# Patient Record
Sex: Male | Born: 1955 | Race: White | Hispanic: No | Marital: Married | Smoking: Former smoker
Health system: Southern US, Community
[De-identification: ages and names within clinical notes are randomized; demographics above are authoritative.]

## PROBLEM LIST (undated history)

## (undated) DIAGNOSIS — R55 Syncope and collapse: Secondary | ICD-10-CM

## (undated) DIAGNOSIS — I493 Ventricular premature depolarization: Secondary | ICD-10-CM

## (undated) DIAGNOSIS — M48061 Spinal stenosis, lumbar region without neurogenic claudication: Secondary | ICD-10-CM

## (undated) DIAGNOSIS — E785 Hyperlipidemia, unspecified: Secondary | ICD-10-CM

## (undated) HISTORY — DX: Syncope and collapse: R55

## (undated) HISTORY — DX: Ventricular premature depolarization: I49.3

## (undated) HISTORY — DX: Hyperlipidemia, unspecified: E78.5

## (undated) HISTORY — DX: Spinal stenosis, lumbar region without neurogenic claudication: M48.061

---

## 2006-01-14 ENCOUNTER — Encounter: Admission: RE | Admit: 2006-01-14 | Discharge: 2006-04-14 | Payer: Self-pay | Admitting: Internal Medicine

## 2008-09-21 ENCOUNTER — Encounter: Admission: RE | Admit: 2008-09-21 | Discharge: 2008-09-21 | Payer: Self-pay | Admitting: Internal Medicine

## 2009-10-04 ENCOUNTER — Encounter: Admission: RE | Admit: 2009-10-04 | Discharge: 2009-10-04 | Payer: Self-pay | Admitting: Internal Medicine

## 2012-03-05 ENCOUNTER — Other Ambulatory Visit: Payer: Self-pay | Admitting: Internal Medicine

## 2012-03-05 DIAGNOSIS — M79606 Pain in leg, unspecified: Secondary | ICD-10-CM

## 2012-03-10 ENCOUNTER — Ambulatory Visit
Admission: RE | Admit: 2012-03-10 | Discharge: 2012-03-10 | Disposition: A | Discharge status: Home / Self Care | Payer: BC Managed Care – PPO | Source: Ambulatory Visit | Attending: Internal Medicine | Admitting: Internal Medicine

## 2012-03-10 DIAGNOSIS — M79606 Pain in leg, unspecified: Secondary | ICD-10-CM

## 2013-10-12 ENCOUNTER — Encounter: Payer: Self-pay | Admitting: Cardiology

## 2013-10-12 ENCOUNTER — Encounter: Payer: Self-pay | Admitting: *Deleted

## 2013-10-12 DIAGNOSIS — R55 Syncope and collapse: Secondary | ICD-10-CM | POA: Insufficient documentation

## 2013-10-12 DIAGNOSIS — E785 Hyperlipidemia, unspecified: Secondary | ICD-10-CM | POA: Insufficient documentation

## 2013-10-12 DIAGNOSIS — I493 Ventricular premature depolarization: Secondary | ICD-10-CM | POA: Insufficient documentation

## 2013-10-13 ENCOUNTER — Ambulatory Visit (HOSPITAL_COMMUNITY): Payer: BC Managed Care – PPO | Attending: Cardiology

## 2013-10-13 ENCOUNTER — Encounter: Payer: Self-pay | Admitting: Cardiology

## 2013-10-13 ENCOUNTER — Other Ambulatory Visit (HOSPITAL_COMMUNITY): Payer: Self-pay | Admitting: Cardiology

## 2013-10-13 ENCOUNTER — Ambulatory Visit (INDEPENDENT_AMBULATORY_CARE_PROVIDER_SITE_OTHER): Payer: BC Managed Care – PPO | Admitting: Cardiology

## 2013-10-13 VITALS — BP 136/86 | HR 57 | Ht 68.0 in | Wt 165.0 lb

## 2013-10-13 DIAGNOSIS — R42 Dizziness and giddiness: Secondary | ICD-10-CM

## 2013-10-13 DIAGNOSIS — I4949 Other premature depolarization: Secondary | ICD-10-CM

## 2013-10-13 DIAGNOSIS — R55 Syncope and collapse: Secondary | ICD-10-CM

## 2013-10-13 DIAGNOSIS — I059 Rheumatic mitral valve disease, unspecified: Secondary | ICD-10-CM

## 2013-10-13 DIAGNOSIS — I493 Ventricular premature depolarization: Secondary | ICD-10-CM

## 2013-10-13 DIAGNOSIS — I340 Nonrheumatic mitral (valve) insufficiency: Secondary | ICD-10-CM | POA: Insufficient documentation

## 2013-10-13 NOTE — Progress Notes (Signed)
Echocardiogram performed.  

## 2013-10-13 NOTE — Patient Instructions (Signed)
Your physician recommends that you continue on your current medications as directed. Please refer to the Current Medication list given to you today.  Your physician has requested that you have an echocardiogram. Echocardiography is a painless test that uses sound waves to create images of your heart. It provides your doctor with information about the size and shape of your heart and how well your heart's chambers and valves are working. This procedure takes approximately one hour. There are no restrictions for this procedure. You will receive a letter in 10 months to call us to schedule your echo 1 year out.   Your physician wants you to follow-up in: 1 year with Dr. Sherlyn Lick will receive a reminder letter in the mail two months in advance. If you don't receive a letter, please call our office to schedule the follow-up appointment.  You have been referred to Dr. Flo Shanks For a consultation for Right ear Pain and Vertigo.

## 2013-10-13 NOTE — Progress Notes (Signed)
  [address] [place], [place]  [address] Phone: [phone] Fax:  [phone]  Date:  10/13/2013   ID:  Islam Eichinger, DOB 12-30-1955, MRN [id number]  PCP:  Lillia Mountain, MD  Cardiologist:  Armanda Magic, MD     History of Present Illness: Ian Patton is a 57 y.o. male with a history for dyslipidemia and presyncope.  He recently saw me in August with complaints of presyncope.  These have occurred with various acitivities including hiking, vigorous walking or running.  He describes the sensation as the room spinning.  He underwent nuclear stress test which showed diaphragmatic attenuation and no ischemia with normal LVF.  Heart monitor showed NSR with PVC's and PAC's.  He had a 2D echo done today which showed normal LVF with mild to mod MR and mild TR.  Since I saw him last his dizziness has significantly improved.  He says that if he gets any dizziness it is very mild and short lived.  He recently went on vacation and went mountain hiking and did not get dizzy at all.   Wt Readings from Last 3 Encounters:  10/13/13 165 lb (74.844 kg)     Past Medical History  Diagnosis Date  . Hyperlipidemia   . Lumbar stenosis   . Near syncope   . PVC's (premature ventricular contractions)     Current Outpatient Prescriptions  Medication Sig Dispense Refill  . Multiple Vitamin (MULTIVITAMIN) capsule Take 1 capsule by mouth daily.       No current facility-administered medications for this visit.    Allergies:    Allergies  Allergen Reactions  . Atorvastatin     Muscle aches (Brand Lipitor ok )    Social History:  The patient  reports that he quit smoking about 23 years ago. He does not have any smokeless tobacco history on file. He reports that he does not use illicit drugs.   Family History:  The patient's family history includes Dementia in his father and mother; Schizophrenia in his brother; Suicidality in his brother and brother.   ROS:  Please see the  history of present illness.      All other systems reviewed and negative.   PHYSICAL EXAM: VS:  BP 136/86  Ht 5\' 8"  (1.727 m)  Wt 165 lb (74.844 kg)  BMI 25.09 kg/m2 Well nourished, well developed, in no acute distress HEENT: normal Neck: no JVD Cardiac:  normal S1, S2; RRR; no murmur Lungs:  clear to auscultation bilaterally, no wheezing, rhonchi or rales Abd: soft, nontender, no hepatomegaly Ext: no edema Skin: warm and dry Neuro:  CNs 2-12 intact, no focal abnormalities noted       ASSESSMENT AND PLAN:  1. Presyncope appears to have resolved with negative cardiac workup.  I think his symptoms are more related to vertigo.  He also has had some pain behind his right ear.  - I will refer him to ENT for eval 2.  Mild to moderate MR by  - will follow with serial echoes yearly   Followup with me in 1 year  Signed, Armanda Magic, MD 10/13/2013 9:54 AM

## 2014-02-01 ENCOUNTER — Encounter: Payer: Self-pay | Admitting: Cardiology

## 2014-03-28 ENCOUNTER — Encounter: Payer: Self-pay | Admitting: Cardiology

## 2014-03-29 ENCOUNTER — Encounter: Payer: Self-pay | Admitting: Cardiology

## 2014-03-30 ENCOUNTER — Encounter: Payer: Self-pay | Admitting: Cardiology

## 2014-03-30 ENCOUNTER — Encounter: Payer: Self-pay | Admitting: Internal Medicine

## 2014-03-31 ENCOUNTER — Encounter: Payer: Self-pay | Admitting: Nurse Practitioner

## 2014-08-18 ENCOUNTER — Encounter: Payer: Self-pay | Admitting: Cardiology

## 2016-06-19 ENCOUNTER — Ambulatory Visit (INDEPENDENT_AMBULATORY_CARE_PROVIDER_SITE_OTHER): Payer: Managed Care, Other (non HMO) | Admitting: Sports Medicine

## 2016-06-19 ENCOUNTER — Encounter: Payer: Self-pay | Admitting: Sports Medicine

## 2016-06-19 VITALS — BP 116/77 | HR 56 | Ht 68.0 in | Wt 155.0 lb

## 2016-06-19 DIAGNOSIS — M23307 Other meniscus derangements, unspecified meniscus, left knee: Secondary | ICD-10-CM | POA: Diagnosis not present

## 2016-06-19 DIAGNOSIS — M23304 Other meniscus derangements, unspecified medial meniscus, left knee: Secondary | ICD-10-CM

## 2016-06-19 DIAGNOSIS — M23301 Other meniscus derangements, unspecified lateral meniscus, left knee: Secondary | ICD-10-CM

## 2016-06-19 DIAGNOSIS — M545 Low back pain, unspecified: Secondary | ICD-10-CM

## 2016-06-19 DIAGNOSIS — M23309 Other meniscus derangements, unspecified meniscus, unspecified knee: Secondary | ICD-10-CM | POA: Insufficient documentation

## 2016-06-19 NOTE — Assessment & Plan Note (Signed)
Needs to resume some flexion exercises  Be careful w lifting and heavier work  Reck if not resolving

## 2016-06-19 NOTE — Assessment & Plan Note (Signed)
HEP  Relative rest Biking OK but keep resistance low  Reck if not improved by 6 wks

## 2016-06-19 NOTE — Progress Notes (Signed)
Patient ID: Ian BilberryJohann Patton, male   DOB: 1956/12/09, 60 y.o.   MRN: [id number]  CC: left knee pain  Active male who bikes, runs and does hiking About 1 month ago more medial joint line pain on Lt No specific injury Worse after running so he stopped Initially some mild swelling Mild pain on bike  Low back pain Hs of bad sciatica down left leg in past Went to ER and put on prednisone 3  days ago chopping wood Since he has back pain  No sciatica No weakness  ROS Denies locking No giving way  No bowel or bladder sxs No weakness in lower ext  Pexam Thin, athletic M in NAD BP 116/77 mmHg  Pulse 56  Ht 5\' 8"  (1.727 m)  Wt 155 lb (70.308 kg)  BMI 23.57 kg/m2  Knee: Normal to inspection with no erythema or effusion or obvious bony abnormalities. Palpation normal with no warmth or joint line tenderness or patellar tenderness or condyle tenderness. ROM normal in flexion and extension and lower leg rotation. Ligaments with solid consistent endpoints including ACL, PCL, LCL, MCL. Negative + Mcmurray's and provocative meniscal tests. Non painful patellar compression. Patellar and quadriceps tendons unremarkable. Hamstring and quadriceps strength is normal.  Low Back Pain with 20 deg extension Pain with flexion at 50 deg Good strength Norm toe and heel walk Loss of lumbar lordosis Neg SLR  US Lt knee  No SPP effusion Mild deg change of med men with mild joint line swelling Lat men is norm PT/QT normal

## 2016-06-20 ENCOUNTER — Encounter: Payer: Self-pay | Admitting: Cardiology

## 2019-02-17 DIAGNOSIS — F419 Anxiety disorder, unspecified: Secondary | ICD-10-CM | POA: Diagnosis not present

## 2019-02-17 DIAGNOSIS — Z0000 Encounter for general adult medical examination without abnormal findings: Secondary | ICD-10-CM | POA: Diagnosis not present

## 2019-02-17 DIAGNOSIS — Z789 Other specified health status: Secondary | ICD-10-CM | POA: Diagnosis not present

## 2019-02-17 DIAGNOSIS — Z1159 Encounter for screening for other viral diseases: Secondary | ICD-10-CM | POA: Diagnosis not present

## 2019-02-17 DIAGNOSIS — D126 Benign neoplasm of colon, unspecified: Secondary | ICD-10-CM | POA: Diagnosis not present

## 2019-05-23 DIAGNOSIS — E559 Vitamin D deficiency, unspecified: Secondary | ICD-10-CM | POA: Diagnosis not present

## 2020-02-22 DIAGNOSIS — E559 Vitamin D deficiency, unspecified: Secondary | ICD-10-CM | POA: Diagnosis not present

## 2020-02-22 DIAGNOSIS — R6889 Other general symptoms and signs: Secondary | ICD-10-CM | POA: Diagnosis not present

## 2020-02-22 DIAGNOSIS — E7800 Pure hypercholesterolemia, unspecified: Secondary | ICD-10-CM | POA: Diagnosis not present

## 2020-02-22 DIAGNOSIS — R251 Tremor, unspecified: Secondary | ICD-10-CM | POA: Diagnosis not present

## 2020-02-22 DIAGNOSIS — N400 Enlarged prostate without lower urinary tract symptoms: Secondary | ICD-10-CM | POA: Diagnosis not present

## 2020-02-22 DIAGNOSIS — Z86010 Personal history of colonic polyps: Secondary | ICD-10-CM | POA: Diagnosis not present

## 2020-02-22 DIAGNOSIS — Z0000 Encounter for general adult medical examination without abnormal findings: Secondary | ICD-10-CM | POA: Diagnosis not present

## 2020-11-16 DIAGNOSIS — Z1159 Encounter for screening for other viral diseases: Secondary | ICD-10-CM | POA: Diagnosis not present

## 2020-11-21 DIAGNOSIS — D123 Benign neoplasm of transverse colon: Secondary | ICD-10-CM | POA: Diagnosis not present

## 2020-11-21 DIAGNOSIS — D124 Benign neoplasm of descending colon: Secondary | ICD-10-CM | POA: Diagnosis not present

## 2020-11-21 DIAGNOSIS — D122 Benign neoplasm of ascending colon: Secondary | ICD-10-CM | POA: Diagnosis not present

## 2020-11-21 DIAGNOSIS — D120 Benign neoplasm of cecum: Secondary | ICD-10-CM | POA: Diagnosis not present

## 2020-11-21 DIAGNOSIS — K5730 Diverticulosis of large intestine without perforation or abscess without bleeding: Secondary | ICD-10-CM | POA: Diagnosis not present

## 2020-11-21 DIAGNOSIS — Z86010 Personal history of colonic polyps: Secondary | ICD-10-CM | POA: Diagnosis not present

## 2020-11-21 DIAGNOSIS — K621 Rectal polyp: Secondary | ICD-10-CM | POA: Diagnosis not present

## 2020-11-21 DIAGNOSIS — K635 Polyp of colon: Secondary | ICD-10-CM | POA: Diagnosis not present

## 2021-04-29 ENCOUNTER — Other Ambulatory Visit (HOSPITAL_COMMUNITY): Payer: Self-pay | Admitting: Internal Medicine

## 2021-04-29 DIAGNOSIS — E7800 Pure hypercholesterolemia, unspecified: Secondary | ICD-10-CM | POA: Diagnosis not present

## 2021-04-29 DIAGNOSIS — I340 Nonrheumatic mitral (valve) insufficiency: Secondary | ICD-10-CM | POA: Diagnosis not present

## 2021-04-29 DIAGNOSIS — Z0000 Encounter for general adult medical examination without abnormal findings: Secondary | ICD-10-CM | POA: Diagnosis not present

## 2021-04-29 DIAGNOSIS — M79641 Pain in right hand: Secondary | ICD-10-CM | POA: Diagnosis not present

## 2021-05-21 ENCOUNTER — Other Ambulatory Visit: Payer: Self-pay | Admitting: Internal Medicine

## 2021-05-21 DIAGNOSIS — E7800 Pure hypercholesterolemia, unspecified: Secondary | ICD-10-CM

## 2021-05-27 ENCOUNTER — Other Ambulatory Visit (HOSPITAL_COMMUNITY): Payer: Managed Care, Other (non HMO)

## 2021-06-07 ENCOUNTER — Other Ambulatory Visit: Payer: Managed Care, Other (non HMO)

## 2021-06-10 ENCOUNTER — Ambulatory Visit
Admission: RE | Admit: 2021-06-10 | Discharge: 2021-06-10 | Disposition: A | Discharge status: Home / Self Care | Payer: No Typology Code available for payment source | Source: Ambulatory Visit | Attending: Internal Medicine | Admitting: Internal Medicine

## 2021-06-10 DIAGNOSIS — E7800 Pure hypercholesterolemia, unspecified: Secondary | ICD-10-CM

## 2021-08-21 ENCOUNTER — Ambulatory Visit (INDEPENDENT_AMBULATORY_CARE_PROVIDER_SITE_OTHER): Payer: BC Managed Care – PPO | Admitting: Internal Medicine

## 2021-08-21 ENCOUNTER — Telehealth (HOSPITAL_COMMUNITY): Payer: Self-pay | Admitting: *Deleted

## 2021-08-21 ENCOUNTER — Encounter: Payer: Self-pay | Admitting: Internal Medicine

## 2021-08-21 ENCOUNTER — Encounter: Payer: Self-pay | Admitting: *Deleted

## 2021-08-21 ENCOUNTER — Other Ambulatory Visit: Payer: Self-pay

## 2021-08-21 ENCOUNTER — Encounter (INDEPENDENT_AMBULATORY_CARE_PROVIDER_SITE_OTHER): Payer: Self-pay

## 2021-08-21 VITALS — BP 110/80 | HR 50 | Ht 68.0 in | Wt 165.0 lb

## 2021-08-21 DIAGNOSIS — E785 Hyperlipidemia, unspecified: Secondary | ICD-10-CM

## 2021-08-21 DIAGNOSIS — R931 Abnormal findings on diagnostic imaging of heart and coronary circulation: Secondary | ICD-10-CM

## 2021-08-21 NOTE — Patient Instructions (Signed)
Medication Instructions:  No changes *If you need a refill on your cardiac medications before your next appointment, please call your pharmacy*   Lab Work: None ordered If you have labs (blood work) drawn today and your tests are completely normal, you will receive your results only by: MyChart Message (if you have MyChart) OR A paper copy in the mail If you have any lab test that is abnormal or we need to change your treatment, we will call you to review the results.   Testing/Procedures: Your physician has requested that you have en exercise stress myoview. For further information please visit [email or web address] Please follow instruction sheet, as given.   Follow-Up: At [facility], you and your health needs are our priority.  As part of our continuing mission to provide you with exceptional heart care, we have created designated Provider Care Teams.  These Care Teams include your primary Cardiologist (physician) and Advanced Practice Providers (APPs -  Physician Assistants and Nurse Practitioners) who all work together to provide you with the care you need, when you need it.  We recommend signing up for the patient portal called "MyChart".  Sign up information is provided on this After Visit Summary.  MyChart is used to connect with patients for Virtual Visits (Telemedicine).  Patients are able to view lab/test results, encounter notes, upcoming appointments, etc.  Non-urgent messages can be sent to your provider as well.   To learn more about what you can do with MyChart, go to [email or web address].    Your next appointment:   9 month(s)  The format for your next appointment:   In Person  Provider:   You may see Dietrich Pates, MD or one of the following Advanced Practice Providers on your designated Care Team:   Tereso Newcomer, PA-C Chelsea Aus, [place]   Other Instructions Dr. Tenny Craw will review statin therapy that you'd tried with your PCP and then we will be in touch.

## 2021-08-21 NOTE — Telephone Encounter (Signed)
Left message on voicemail in reference to upcoming appointment scheduled for 08/28/21. Phone number given for a call back so details instructions can be given. Ian Patton, [name] No mychart available

## 2021-08-21 NOTE — Progress Notes (Signed)
Cardiology Office Note   Date:  08/21/2021   ID:  Ian Patton, DOB [birth year], MRN [id number]  PCP:  Kirby Funk, MD  Cardiologist:   Dietrich Pates, MD   Patient presents for evaluation of CAD    History of Present Illness: Breylin Patton is a 65 y.o. male who is followed by Roseanne Reno   Recently had a calcium score CT that showed a calcum score of551 ( 363 in LAD; 15 LCx  173 RCA)     The pt is very active   Bikes regularly  He says it is harder than it was 5 years ago but no rapid change Notes occasional chest tightness   Maybe a little more when pushes    He has no palpitaiotns   Occasional dizziness but n o syncope    Quit tob 30 years    Diet:    Br  Toast   Fruit   Oatmeal with seeds Lunch:   Salads     Soups   Evening:   Pasta, fish,   rare meat     Veggies  Drinks   Tea unsweet  Beer   wine      2 to 3 beers per day   No snack      Current Meds  Medication Sig   aspirin EC 81 MG tablet Take 81 mg by mouth daily. Swallow whole.   Multiple Vitamin (MULTIVITAMIN) capsule Take 1 capsule by mouth daily.     Allergies:   Atorvastatin   Past Medical History:  Diagnosis Date   Hyperlipidemia    Lumbar stenosis    Near syncope    PVC's (premature ventricular contractions)     History reviewed. No pertinent surgical history.   Social History:  The patient  reports that he has quit smoking. His smoking use included cigarettes. He has never used smokeless tobacco. He reports that he does not use drugs.   Family History:  The patient's family history includes Dementia in his father and mother; Schizophrenia in his brother; Suicidality in his brother and brother.   PGF with CVA and MI   Uncle paternal with MI    ROS:  Please see the history of present illness. All other systems are reviewed and  Negative to the above problem except as noted.    PHYSICAL EXAM: VS:  BP 110/80   Pulse (!) 50   Ht 5\' 8"  (1.727 m)   Wt 74.8 kg   SpO2 98%   BMI 25.09 kg/m    GEN: Well nourished, well developed, in no acute distress  HEENT: normal  Neck: no JVD, carotid bruits, Cardiac: RRR; no murmurs, rubs, or gallops,no LE edema  Respiratory:  clear to auscultation bilaterally, normal work of breathing GI: soft, nontender, nondistended, + BS  No hepatomegaly  MS: no deformity Moving all extremities   Skin: warm and dry, no rash Neuro:  Strength and sensation are intact Psych: euthymic mood, full affect   EKG:  EKG is ordered today.  Sinus bradycardia 50 bpm    Lipid Panel No results found for: CHOL, TRIG, HDL, CHOLHDL, VLDL, LDLCALC, LDLDIRECT    Wt Readings from Last 3 Encounters:  08/21/21 74.8 kg  06/19/16 70.3 kg  10/13/13 74.8 kg      ASSESSMENT AND PLAN:  1  CAD   Pt with elevated coronary calcium (551)   He is very activie   I am not convinced of any symptoms but given score I would recomm  a stress myoview to evaluat Keep on ASA   I would start statin  2   Lipids  LDL in May was 190  HDL 79   will get records for J Griffen    He says he may have trid a statin inp ast    Will review    3  Bradcyardia   No symptoms   Follow        Current medicines are reviewed at length with the patient today.  The patient does not have concerns regarding medicines.  Signed, Dietrich Pates, MD  08/21/2021 8:27 AM    [facility] [address] [place], [place], [place]  [address] Phone: [phone]; Fax: [phone]

## 2021-08-22 ENCOUNTER — Telehealth (HOSPITAL_COMMUNITY): Payer: Self-pay | Admitting: Radiology

## 2021-08-22 NOTE — Telephone Encounter (Signed)
Patient given detailed instructions per Myocardial Perfusion Study Information Sheet for the test on 08/28/2021 at 7:45. Patient notified to arrive 15 minutes early and that it is imperative to arrive on time for appointment to keep from having the test rescheduled.  If you need to cancel or reschedule your appointment, please call the office within 24 hours of your appointment. . Patient verbalized understanding.EHK

## 2021-08-28 ENCOUNTER — Other Ambulatory Visit: Payer: Self-pay

## 2021-08-28 ENCOUNTER — Ambulatory Visit (HOSPITAL_COMMUNITY): Payer: BC Managed Care – PPO | Attending: Internal Medicine

## 2021-08-28 DIAGNOSIS — E785 Hyperlipidemia, unspecified: Secondary | ICD-10-CM | POA: Diagnosis not present

## 2021-08-28 DIAGNOSIS — R931 Abnormal findings on diagnostic imaging of heart and coronary circulation: Secondary | ICD-10-CM | POA: Diagnosis not present

## 2021-08-28 LAB — MYOCARDIAL PERFUSION IMAGING
Angina Index: 0
Duke Treadmill Score: 12
Estimated workload: 13.4
Exercise duration (min): 12 min
Exercise duration (sec): 0 s
LV dias vol: 102 mL (ref 62–150)
LV sys vol: 45 mL
MPHR: 154 {beats}/min
Nuc Stress EF: 56 %
Peak HR: 130 {beats}/min
Percent HR: 84 %
Rest HR: 49 {beats}/min
Rest Nuclear Isotope Dose: 10.9 mCi
SDS: 0
SRS: 0
SSS: 0
ST Depression (mm): 0 mm
Stress Nuclear Isotope Dose: 31.6 mCi
TID: 0.99

## 2021-08-28 MED ORDER — TECHNETIUM TC 99M TETROFOSMIN IV KIT
31.6000 | PACK | Freq: Once | INTRAVENOUS | Status: AC | PRN
  Administered 2021-08-28: 31.6 via INTRAVENOUS
  Filled 2021-08-28: qty 32

## 2021-08-28 MED ORDER — TECHNETIUM TC 99M TETROFOSMIN IV KIT
10.0000 | PACK | Freq: Once | INTRAVENOUS | Status: AC | PRN
  Administered 2021-08-28: 10 via INTRAVENOUS
  Filled 2021-08-28: qty 10

## 2021-08-30 ENCOUNTER — Telehealth: Payer: Self-pay | Admitting: Internal Medicine

## 2021-08-30 NOTE — Telephone Encounter (Signed)
The patient has been notified of the result and verbalized understanding.  All questions (if any) were answered. Sampson Goon, RN 08/30/2021 8:46 AM

## 2021-08-30 NOTE — Telephone Encounter (Signed)
Patient returning call to discuss stress test results  

## 2021-09-03 ENCOUNTER — Telehealth: Payer: Self-pay | Admitting: Internal Medicine

## 2021-09-03 NOTE — Telephone Encounter (Signed)
Only record received from Dr Kandyce Rud was recent lipid panel He had been on atorvstatin in past   Did not tolerate Would like to try Crestor 10 mg   Have not seen any other records    If tries then f/u lipomed in 8 wks with Lp(a) and ApoB

## 2021-09-03 NOTE — Telephone Encounter (Signed)
Labs from [employer] office      LDL 190    I do not have records re statins he has tried   Only sensitivity was atorvastatin     If there is no documented sensitivity I would recomm a trial of Crestor 10 mg    Chemical makeup very different from Atorvastatin    F/U lipomed, Lp9A) and ApoB in 8 wks with liver panel

## 2021-09-03 NOTE — Telephone Encounter (Signed)
Error

## 2021-09-03 NOTE — Telephone Encounter (Signed)
Spoke with patient. Has not tried any other medications but atorvastatin.  We reviewed recent low risk stress test and calcium score ct scan in 82nd %.  He would like to read/research Crestor before making a decision about starting it.  All of his question/concerns were addressed.  He will call back to let Dr. Tenny Craw know what he has decided.

## 2021-09-03 NOTE — Telephone Encounter (Signed)
Left message for patient to call back  

## 2021-12-20 ENCOUNTER — Telehealth (HOSPITAL_COMMUNITY): Payer: Self-pay | Admitting: Internal Medicine

## 2021-12-20 NOTE — Telephone Encounter (Signed)
Patient called and cancelled echocardiogram for reason below:  12/20/2021 10:15 AM [id number], Ian Patton  Cancel Rsn: Patient (needs to think about it still)  Order will be removed from the active echo WQ and if patient calls back to reschedule we will reinstate the order.   Thank you.

## 2021-12-25 ENCOUNTER — Other Ambulatory Visit (HOSPITAL_COMMUNITY): Payer: Managed Care, Other (non HMO)

## 2022-03-03 IMAGING — CT CT CARDIAC CORONARY ARTERY CALCIUM SCORE
3 series · 14 of 20 positions shown, 16 images · non-contrast
Comparison: None.

CLINICAL DATA: 65-year-old Caucasian male with history of
hyperlipidemia and former smoking history.

EXAM:
CT CARDIAC CORONARY ARTERY CALCIUM SCORE
TECHNIQUE: Non-contrast imaging through the heart was performed using
prospective ECG gating. Image post processing was performed on an
independent workstation, allowing for quantitative analysis of the
heart and coronary arteries. Note that this exam targets the heart
and the chest was not imaged in its entirety.

[Series 2: calcium scoring 2.00 qr36 bestdiast 69% hrt calciu · axial · 0.38mm/px · z∈[+1774,+1852]mm · 4 of 65 slices shown]
[im 13/65  vessel]
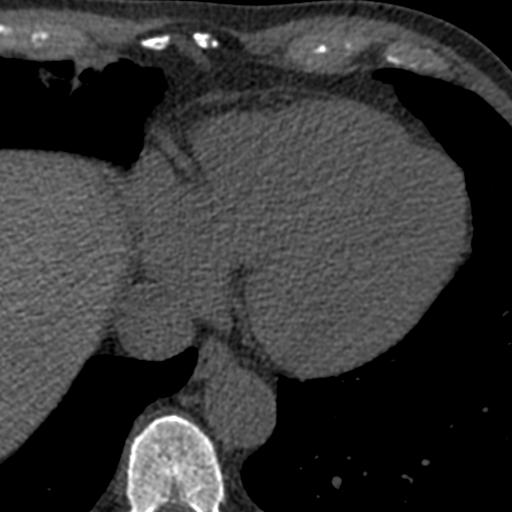
[im 26/65  vessel]
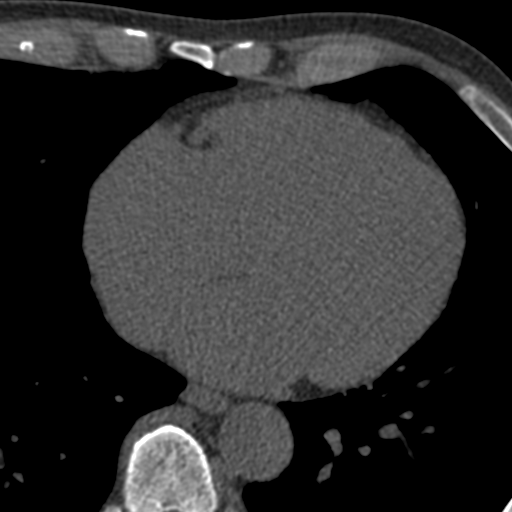
[im 39/65  vessel]
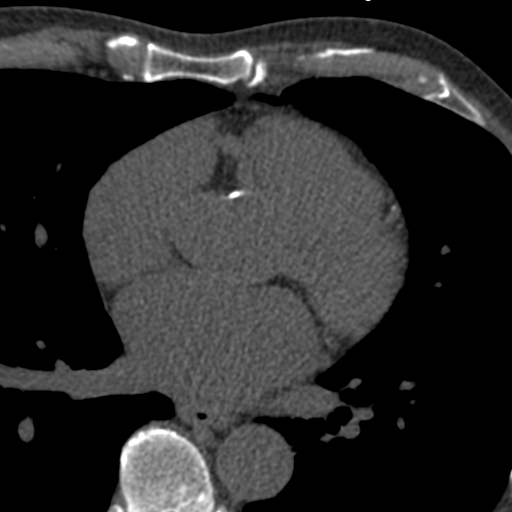
[im 52/65  vessel]
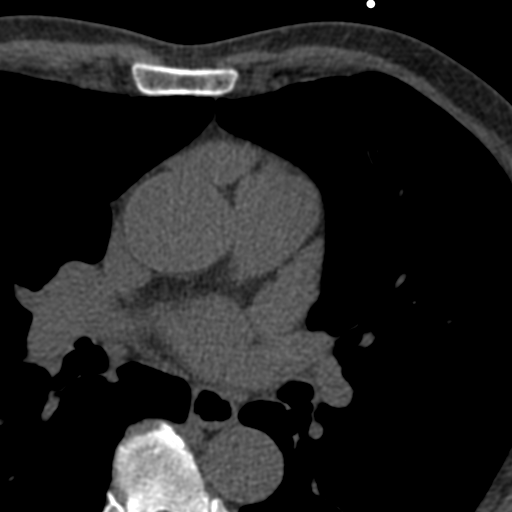

[Series 3: calcium scoring 2.00 br40 bestdiast 69% axial · axial · 0.59mm/px · z∈[+1770,+1856]mm · 5 of 65 slices shown, 7 images]
[im 11/65  vessel]
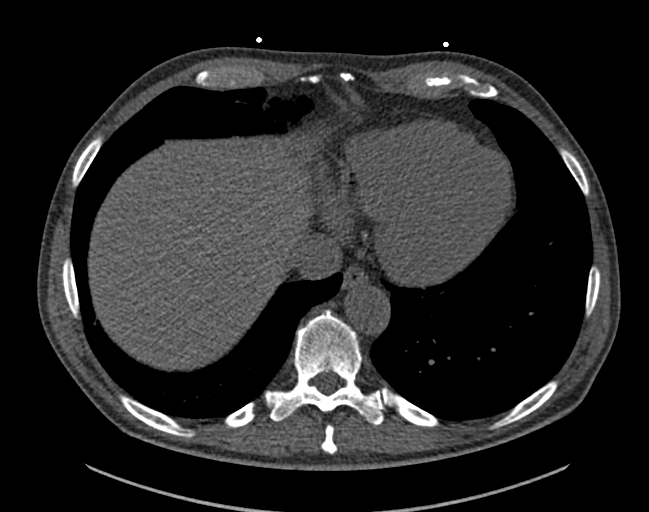
[im 11/65  lung]
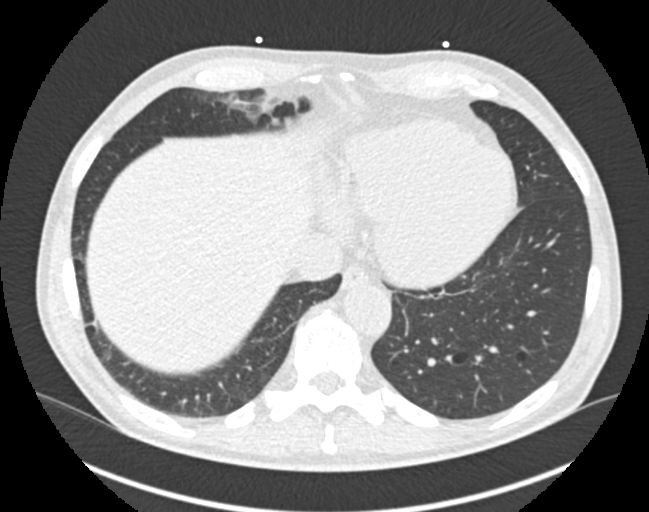
[im 22/65  vessel]
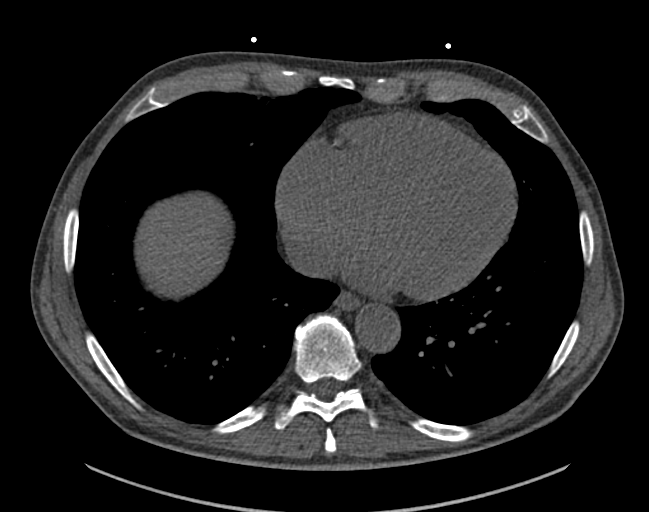
[im 33/65  vessel]
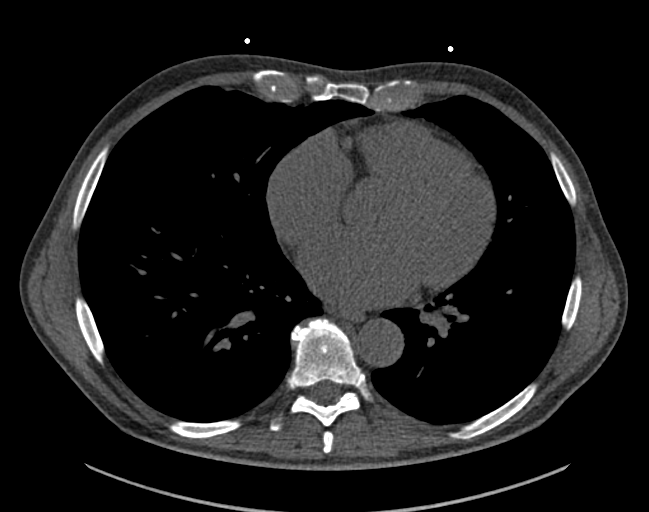
[im 43/65  vessel]
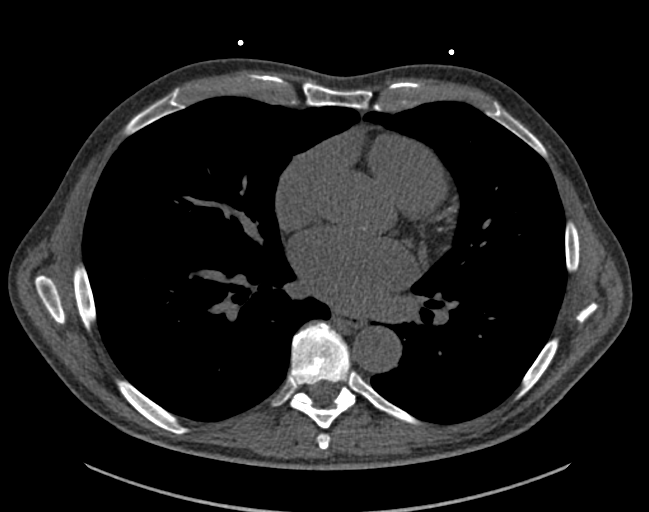
[im 54/65  vessel]
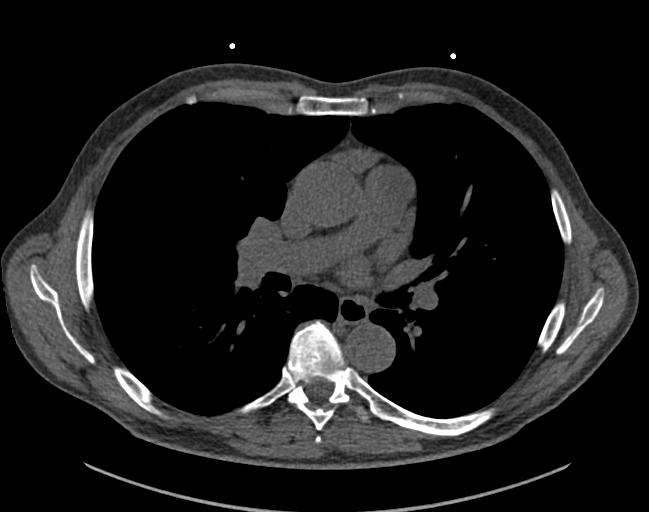
[im 54/65  lung]
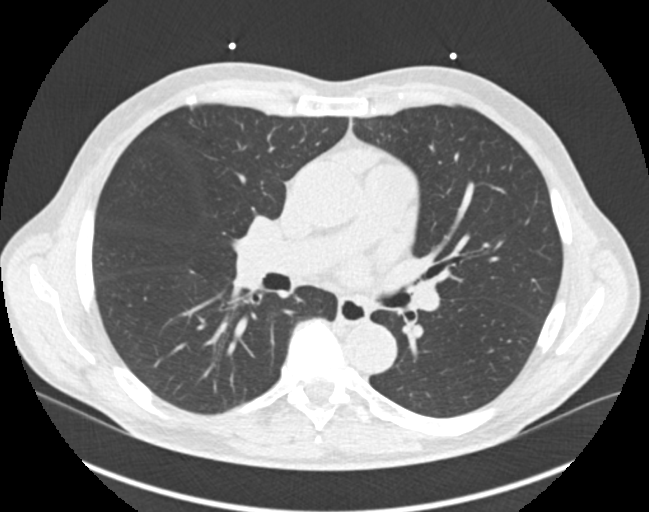

[Series 9: calcium scoring 2.00 br60 bestdiast 69% lungs · axial · 0.59mm/px · z∈[+1770,+1856]mm · 5 of 65 slices shown]
[im 11/65  vessel]
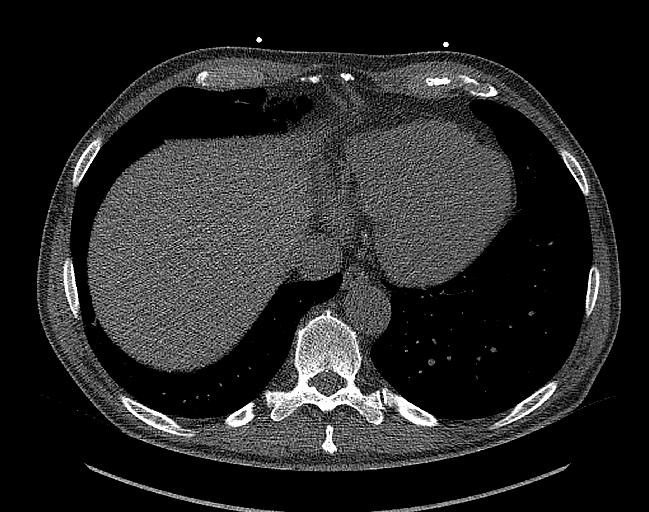
[im 22/65  vessel]
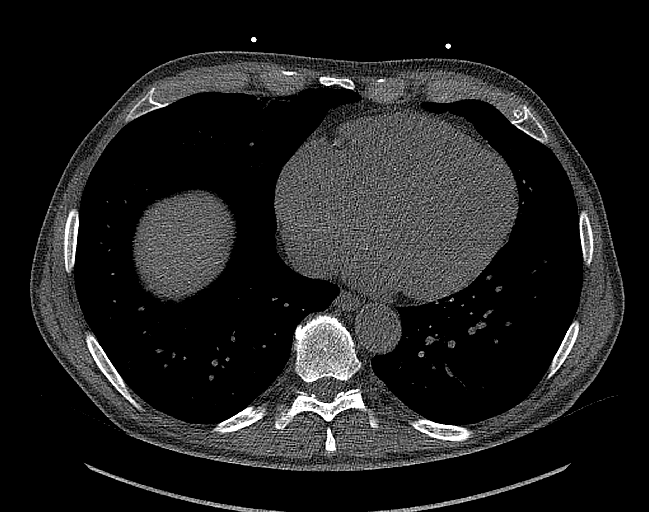
[im 33/65  vessel]
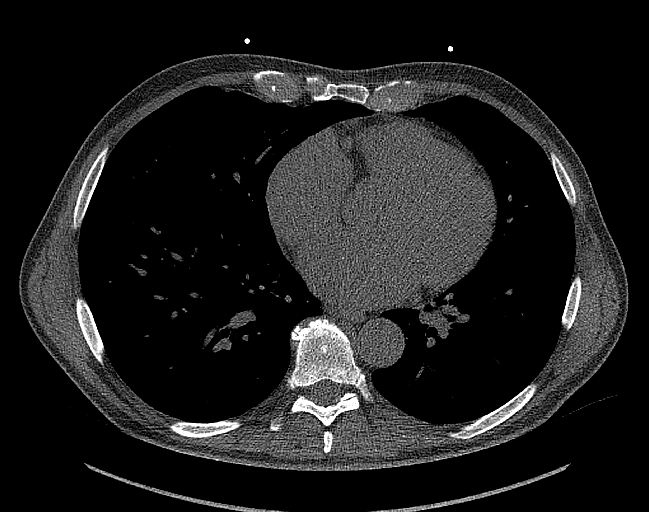
[im 43/65  vessel]
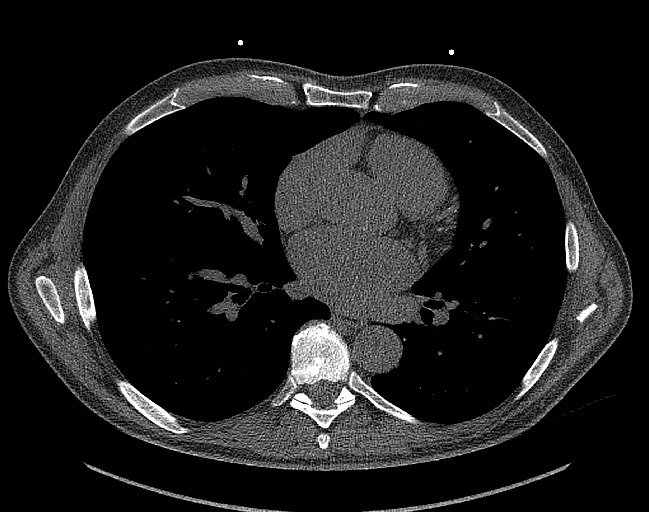
[im 54/65  vessel]
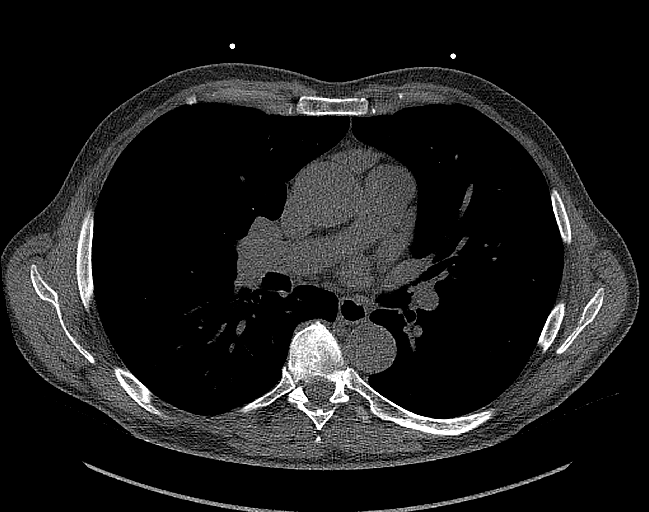

[14 of 20 positions shown; findings below may reference images not displayed]

FINDINGS: CORONARY CALCIUM SCORES:

Left Main: 0

LAD: 363

LCx: 15

RCA: 173

Total Agatston Score: 551

[HOSPITAL] percentile: 82

AORTA MEASUREMENTS:

Ascending Aorta: 37 mm

Descending Aorta: 28 mm

OTHER FINDINGS:

The heart size is within normal limits. No pericardial fluid
identified. Visualized segments of the thoracic aorta and central
pulmonary arteries are normal in caliber. Visualized mediastinum and
hilar regions demonstrate no lymphadenopathy or masses. Visualized
lungs show no evidence of pulmonary edema, consolidation,
pneumothorax, nodule or pleural fluid. Visualized upper abdomen and
bony structures are unremarkable.
IMPRESSION: Coronary calcium score of 551 is at the 82nd percentile for the
patient's age, sex and race.

## 2024-10-04 ENCOUNTER — Other Ambulatory Visit: Payer: Self-pay | Admitting: Internal Medicine

## 2024-10-04 DIAGNOSIS — N6312 Unspecified lump in the right breast, upper inner quadrant: Secondary | ICD-10-CM

## 2024-10-17 ENCOUNTER — Ambulatory Visit: Payer: Self-pay | Admitting: Surgery

## 2024-10-17 NOTE — Progress Notes (Signed)
 REFERRING PHYSICIAN:  Jama Dorothe RAMAN, MD PROVIDER:  DEBBY CURTISTINE SHIPPER, MD MRN: [id number] DOB: August 06, 1956 DATE OF ENCOUNTER: 10/17/2024 Subjective    Chief Complaint: New Consultation   History of Present Illness: Ian Patton is a 68 y.o. male who is seen today as an office consultation for evaluation of New Consultation   Patient presents for evaluation of a painless nodule noted right central breast.  He noticed it a few weeks ago on vacation.  He underwent workup which included an ultrasound, mammogram and core biopsy which showed grade 2 invasive mammary carcinoma receptors and subtype pending.  This measured 1.1 cm location with central right breast.  No family history of breast cancer of any cancer he is aware of.  He is quite healthy.  He has some bruising at the biopsy site.    Review of Systems: A complete review of systems was obtained from the patient.  I have reviewed this information and discussed as appropriate with the patient.  See HPI as well for other ROS.     Medical History: No past medical history on file.  There is no problem list on file for this patient.   No past surgical history on file.   No Known Allergies  Current Outpatient Medications on File Prior to Visit  Medication Sig Dispense Refill  . ezetimibe (ZETIA) 10 mg tablet Take 10 mg by mouth once daily    . aspirin 81 MG EC tablet Take 81 mg by mouth once daily     No current facility-administered medications on file prior to visit.    No family history on file.   Social History   Tobacco Use  Smoking Status Not on file  Smokeless Tobacco Not on file     Social History   Socioeconomic History  . Marital status: Married   Social Drivers of Health   Housing Stability: Unknown (10/17/2024)   Housing Stability Vital Sign   . Homeless in the Last Year: No    Objective:   Vitals:   10/17/24 1010 10/17/24 1011  BP: 134/89   Pulse: 87   Temp: 36.8 C (98.3 F)   SpO2:  99%   Weight: 81.6 kg (179 lb 12.8 oz)   Height: 172.7 cm (5' 8)   PainSc:  0-No pain    Body mass index is 27.34 kg/m.  Physical Exam Vitals reviewed. Exam conducted with a chaperone present.  Cardiovascular:     Rate and Rhythm: Normal rate.  Pulmonary:     Effort: Pulmonary effort is normal.  Chest:  Breasts:    Left: Normal.       Comments: Bruising central right breast noted.  Mobile mass noted measuring a centimeter. Musculoskeletal:        General: Normal range of motion.  Lymphadenopathy:     Upper Body:     Right upper body: No supraclavicular or axillary adenopathy.     Left upper body: No supraclavicular or axillary adenopathy.  Skin:    General: Skin is warm.  Neurological:     General: No focal deficit present.     Mental Status: He is alert.        Labs, Imaging and Diagnostic Testing: Mammogram ultrasound showed a 1.1 cm mass central right breast core biopsy proven to be invasive mammary carcinoma with receptors and subtype pending. Imaging shows no axillary adenopathy. Assessment and Plan:     Diagnoses and all orders for this visit:  Breast cancer, stage 1, right (  CMS/HHS-HCC)    Reviewed breast cancer males  Recommend right simple mastectomy with right axillary sent lymph node mapping.  Pros and cons of this reviewed with the patient.  Long-term expectations and expected recovery reviewed.  Risk of bleeding, infection, flap necrosis, pain, swelling, cosmetic deformity, lymphedema, shoulder stiffness, injury to underlying blood vessels, nerves, blood biotics reviewed.  He is quite thin and has very little breast tissue therefore breast conservation serves no purpose from the standpoint of cosmesis in his case.  Refer to medical oncology  Consider genetics    DEBBY CURTISTINE SHIPPER, MD    I spent a total of 51 minutes in both face-to-face and non-face-to-face activities, excluding procedures performed, for this visit on the date of this  encounter.

## 2024-10-20 ENCOUNTER — Other Ambulatory Visit: Payer: Self-pay

## 2024-10-25 ENCOUNTER — Inpatient Hospital Stay: Payer: Self-pay | Attending: Hematology and Oncology | Admitting: Hematology and Oncology

## 2024-10-25 ENCOUNTER — Encounter: Payer: Self-pay | Admitting: *Deleted

## 2024-10-25 VITALS — BP 138/84 | HR 63 | Temp 98.5°F | Resp 16 | Wt 180.5 lb

## 2024-10-25 DIAGNOSIS — C50121 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: Secondary | ICD-10-CM | POA: Insufficient documentation

## 2024-10-25 DIAGNOSIS — Z170 Estrogen receptor positive status [ER+]: Secondary | ICD-10-CM | POA: Insufficient documentation

## 2024-10-25 NOTE — Assessment & Plan Note (Addendum)
 Palpable painless nodule right central breast, mammogram and ultrasound: 1.1 cm biopsy: Grade 2 invasive mammary cancer, prognostic panel pending  Pathology and radiology counseling:Discussed with the patient, the details of pathology including the type of breast cancer,the clinical staging, the significance of ER, PR and HER-2/neu receptors and the implications for treatment. After reviewing the pathology in detail, we proceeded to discuss the different treatment options between surgery, radiation, chemotherapy, antiestrogen therapies.  Recommendations: 1.  Right mastectomy with sentinel lymph node biopsy 2.  If pathology is estrogen receptor positive and HER2 negative: Oncotype DX testing to determine if chemotherapy would be of any benefit followed by 3. Adjuvant antiestrogen therapy with tamoxifen x 5-10 years (if it is ER positive)  Oncotype counseling: I discussed Oncotype DX test. I explained to the patient that this is a 21 gene panel to evaluate patient tumors DNA to calculate recurrence score. This would help determine whether patient has high risk or low risk breast cancer. She understands that if her tumor was found to be high risk, she would benefit from systemic chemotherapy. If low risk, no need of chemotherapy.  Return to clinic after surgery to discuss final pathology report and then determine if Oncotype DX testing will need to be sent.

## 2024-10-25 NOTE — Progress Notes (Signed)
  Cancer Center CONSULT NOTE  Patient Care Team: Signa Rush, MD (Inactive) as PCP - General (Internal Medicine) Okey Vina GAILS, MD as PCP - Cardiology (Cardiology) Signa Rush, MD (Inactive) (Internal Medicine) Gerome Devere HERO, RN as Oncology Nurse Navigator  CHIEF COMPLAINTS/PURPOSE OF CONSULTATION:  Newly diagnosed breast cancer  HISTORY OF PRESENTING ILLNESS:   History of Present Illness Ian Patton is a 68 year old male who presents for oncology consultation. He was referred by Dr. Vanderbilt for evaluation of breast cancer.  He discovered a lump under his nipple while on vacation, accompanied by a tingling, gripping sensation. Diagnostic workup with mammogram, ultrasound, and biopsy confirmed invasive ductal carcinoma. The cancer is estrogen receptor 100% positive, progesterone receptor negative, and HER2 negative. Imaging estimated the tumor size at 1.1 cm. The KI-67 proliferation index is 40%, indicating a moderate rate of cell division. The tumor is graded as a grade 2.  There is no family history of breast cancer, and he has no Ashkenazi Lobbyist.  I reviewed her records extensively and collaborated the history with the patient.  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  SOCIAL HISTORY: Social History   Socioeconomic History   Marital status: Married    Spouse name: Not on file   Number of children: Not on file   Years of education: Not on file   Highest education  level: Not on file  Occupational History   Not on file  Tobacco Use   Smoking status: Former    Types: Cigarettes   Smokeless tobacco: Never  Substance and Sexual Activity   Alcohol use: Not on file   Drug use: No   Sexual activity: Not on file  Other Topics Concern   Not on file  Social History Narrative   ** Merged History Encounter **       Social Drivers of Health   Financial Resource Strain: Not on file  Food Insecurity: No Food Insecurity (10/25/2024)   Hunger Vital Sign    Worried About Running Out of Food in the Last Year: Never true    Ran Out of Food in the Last Year: Never true  Transportation Needs: No Transportation Needs (10/25/2024)   PRAPARE - Administrator, Civil Service (Medical): No    Lack of Transportation (Non-Medical): No  Physical Activity: Not on file  Stress: Not on file  Social Connections: Not on file  Intimate Partner Violence: Not on file    FAMILY HISTORY: Family History  Problem Relation Age of Onset   Dementia Mother    Dementia Father    Schizophrenia Brother    Suicidality Brother    Suicidality Brother     ALLERGIES:  is allergic to atorvastatin.  MEDICATIONS:  Current Outpatient Medications  Medication Sig Dispense Refill   aspirin EC 81 MG tablet Take 81 mg by mouth daily. Swallow whole.     Multiple Vitamin (MULTIVITAMIN) capsule Take 1 capsule by mouth daily.  No current facility-administered medications for this visit.    REVIEW OF SYSTEMS:   Constitutional: Denies fevers, chills or abnormal night sweats Breast: Oval lump in the right breast All other systems were reviewed with the patient and are negative.  PHYSICAL EXAMINATION: ECOG PERFORMANCE STATUS: 0 - Asymptomatic  Vitals:   10/25/24 1608  BP: 138/84  Pulse: 63  Resp: 16  Temp: 98.5 F (36.9 C)  SpO2: 99%   Filed Weights   10/25/24 1608  Weight: 180 lb 8 oz (81.9 kg)    GENERAL:alert, no distress and comfortable       RADIOGRAPHIC STUDIES: I have personally reviewed the radiological reports and agreed with the findings in the report.  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 After reviewing the pathology in detail, we proceeded to discuss the different treatment options between surgery, radiation, chemotherapy, antiestrogen therapies.  Recommendations: 1.  Right mastectomy with sentinel lymph node biopsy 2.  Oncotype DX testing to determine if chemotherapy would be of any benefit followed by 3. Adjuvant antiestrogen therapy with tamoxifen x 5-10 years   4. Genetic Testing requested  Oncotype counseling: I discussed Oncotype DX test. I explained to the patient that this is a 21 gene panel to evaluate patient tumors DNA to calculate recurrence score. This would help determine whether patient has high risk or low risk breast cancer. She understands that if her tumor was found to be high risk, she would benefit from systemic chemotherapy. If low risk, no need of chemotherapy.  Return to clinic after Oncotype DX testing results are available   All questions were answered. The patient knows to call the clinic with any problems, questions or concerns. I personally spent a total of 30 minutes in the care of the patient today including preparing to see the patient, getting/reviewing separately obtained history, performing a medically appropriate exam/evaluation, counseling and educating, placing orders, referring and communicating with other health care professionals, documenting  clinical information in the EHR, independently interpreting results, communicating results, and coordinating care.   Viinay K Terik Haughey, MD 10/25/24

## 2024-10-26 ENCOUNTER — Encounter: Payer: Self-pay | Admitting: *Deleted

## 2024-10-26 ENCOUNTER — Encounter: Payer: Self-pay | Admitting: Diagnostic Radiology

## 2024-10-26 NOTE — Progress Notes (Signed)
 Attended initial med onc appt with patient, wife, and Dr.Gudena. Introduced the role of the navigator. Genetics referral has been placed per MD order. Patient is aware how to contact navigator for any needs/concerns.   Patient called 11/12 with a few questions for navigator. Able to answer them and he was appreciative. Aware to call for any other questions.

## 2024-10-27 ENCOUNTER — Encounter (HOSPITAL_BASED_OUTPATIENT_CLINIC_OR_DEPARTMENT_OTHER): Payer: Self-pay | Admitting: Surgery

## 2024-10-27 ENCOUNTER — Encounter: Payer: Self-pay | Admitting: *Deleted

## 2024-10-27 ENCOUNTER — Telehealth: Payer: Self-pay | Admitting: Hematology and Oncology

## 2024-10-27 ENCOUNTER — Other Ambulatory Visit: Payer: Self-pay

## 2024-10-27 DIAGNOSIS — C50121 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: Secondary | ICD-10-CM

## 2024-10-27 NOTE — Telephone Encounter (Signed)
 left vm for pt about scheduled appt date and time. Encouraged to call back if need to reschedule

## 2024-10-31 ENCOUNTER — Encounter: Payer: Self-pay | Admitting: Physical Therapy

## 2024-10-31 ENCOUNTER — Inpatient Hospital Stay: Admitting: Licensed Clinical Social Worker

## 2024-10-31 ENCOUNTER — Other Ambulatory Visit: Payer: Self-pay

## 2024-10-31 ENCOUNTER — Ambulatory Visit: Attending: Surgery | Admitting: Physical Therapy

## 2024-10-31 DIAGNOSIS — C50129 Malignant neoplasm of central portion of unspecified male breast: Secondary | ICD-10-CM | POA: Diagnosis present

## 2024-10-31 DIAGNOSIS — C50111 Malignant neoplasm of central portion of right female breast: Secondary | ICD-10-CM

## 2024-10-31 DIAGNOSIS — Z170 Estrogen receptor positive status [ER+]: Secondary | ICD-10-CM | POA: Insufficient documentation

## 2024-10-31 DIAGNOSIS — R293 Abnormal posture: Secondary | ICD-10-CM | POA: Insufficient documentation

## 2024-10-31 DIAGNOSIS — C50121 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: Secondary | ICD-10-CM | POA: Diagnosis not present

## 2024-10-31 NOTE — Progress Notes (Signed)
 [facility] Clinical Social Work  Initial Assessment   Ian Patton is a 68 y.o. year old male contacted by phone. Clinical Social Work was referred by new patient protocol for assessment of psychosocial needs.   SDOH (Social Determinants of Health) assessments performed: Yes- confirmed from 11/11   SDOH Screenings   Food Insecurity: No Food Insecurity (10/25/2024)  Housing: Low Risk  (10/25/2024)  Transportation Needs: No Transportation Needs (10/25/2024)  Utilities: Not At Risk (10/25/2024)  Tobacco Use: Medium Risk (10/27/2024)    PHQ 2/9:    06/19/2016    4:21 PM  Depression screen PHQ 2/9  Decreased Interest 0  Down, Depressed, Hopeless 0  PHQ - 2 Score 0     Distress Screen completed: No     No data to display            Family/Social Information:  Housing Arrangement: patient lives with his spouse Family members/support persons in your life? Spouse- has not told others about diagnosis Transportation concerns: no  Employment: Retired.  Financial concerns: No Type of concern: None Food access concerns: no Advanced directives: Not known Services Currently in place:  Medicare + BCBS supplement  Coping/ Adjustment to diagnosis: Patient understands treatment plan and what happens next? yes, has surgery on 11/20. This has been stressful for him on top of pre-existing life stress. He is wondering if that contributed to or caused the cancer. He expressed trepidation about having a permanent visual reminder of the cancer by way of scar as well. He is processing with just his sig other for now and does not want to share with others until he has a better handle on it himself Concerns about diagnosis and/or treatment: Body image and fear of unknown cause/ preventing recurrence Patient reported stressors: Adjusting to my illness Current coping skills/ strengths: Ability for insight , Capable of independent living , and Supportive family/friends     SUMMARY: Current SDOH  Barriers:  No major barriers identified today  Clinical Social Work Clinical Goal(s):  No clinical social work goals at this time  Interventions: Discussed common feeling and emotions when being diagnosed with cancer, and the importance of support during treatment Informed patient of the support team roles and support services at [facility] Encouraged patient to call with any questions or concerns Provided brief mental health counseling with regard to adjustment to illness    Follow Up Plan: Patient will contact CSW with any support or resource needs Patient verbalizes understanding of plan: Yes    Shantay Sonn E Laconda Basich, LCSW Clinical Social Worker [facility]

## 2024-10-31 NOTE — Therapy (Signed)
 OUTPATIENT PHYSICAL THERAPY BREAST CANCER BASELINE EVALUATION   Patient Name: Ian Patton MRN: [id number] DOB:29-Mar-1956, 68 y.o., male Today's Date: 10/31/2024  END OF SESSION:  PT End of Session - 10/31/24 1408     Visit Number 1    Number of Visits 2    Date for Recertification  12/26/24    PT Start Time 1402    PT Stop Time 1450    PT Time Calculation (min) 48 min    Activity Tolerance Patient tolerated treatment well    Behavior During Therapy [facility] for tasks assessed/performed          Past Medical History:  Diagnosis Date   Hyperlipidemia    Lumbar stenosis    Near syncope    PVC's (premature ventricular contractions)    History reviewed. No pertinent surgical history. 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 Debby Shipper  REFERRING DIAG: Right breast cancer  THERAPY DIAG:  Malignant neoplasm of central portion of right breast in male, estrogen receptor positive (HCC)  Abnormal posture  Rationale for Evaluation and Treatment: Rehabilitation  ONSET DATE: 10/15/2024  SUBJECTIVE:                                                                                                                                                                                           SUBJECTIVE STATEMENT: Patient reports he is here today to be seen by her medical team for his newly diagnosed right breast cancer.   PERTINENT HISTORY:  Patient was diagnosed on 10/15/2024 with right grade 2 invasive ductal carcinoma breast cancer. It measures 1.1 cm and is located in the central quadrant. It is ER positive, PR and HER2 negative with a Ki67 of 40% He has no other  medical problems.   PATIENT GOALS:   reduce lymphedema risk and learn post op HEP.   PAIN:  Are you having pain? No  PRECAUTIONS: Active CA   RED FLAGS: None   HAND DOMINANCE: right  WEIGHT BEARING RESTRICTIONS: No  FALLS:  Has patient fallen in last 6 months? No  LIVING ENVIRONMENT: Patient lives with: wife Lives in: House/apartment Has following equipment at home: None  OCCUPATION: Retired from consulting civil engineer)  LEISURE: Bikes regularly up to 60 miles at a time; hikes some - prior to his diagnosis was  doing some cardio 5x/week  PRIOR LEVEL OF FUNCTION: Independent   OBJECTIVE: Note: Objective measures were completed at Evaluation unless otherwise noted.  COGNITION: Overall cognitive status: Within functional limits for tasks assessed    POSTURE:  Forward head and rounded shoulders posture  UPPER EXTREMITY AROM/PROM:  A/PROM RIGHT   eval   Shoulder extension 59  Shoulder flexion 148  Shoulder abduction 161  Shoulder internal rotation 77  Shoulder external rotation 90    (Blank rows = not tested)  A/PROM LEFT   eval  Shoulder extension 63  Shoulder flexion 133  Shoulder abduction 157  Shoulder internal rotation 75  Shoulder external rotation 88    (Blank rows = not tested)  CERVICAL AROM: All within normal limits  UPPER EXTREMITY STRENGTH: WNL  LYMPHEDEMA ASSESSMENTS (in cm):   LANDMARK RIGHT   eval  10 cm proximal to olecranon process from proximal aspect of olecranon 28.7  Olecranon process 27.1  10 cm proximal to ulnar styloid process from proximal aspect of styloid process 24  Just distal to ulnar styloid process 18  Across hand at thumb web space 21.7  At base of 2nd digit 7.8  (Blank rows = not tested)  LANDMARK LEFT   eval  10 cm proximal to olecranon process from proximal aspect of olecranon 26.9   Olecranon process 26.2  10 cm proximal to ulnar styloid process from proximal aspect of styloid process 23  Just  distal to ulnar styloid process 18  Across hand at thumb web space 22.1  At base of 2nd digit 7.1  (Blank rows = not tested)  L-DEX LYMPHEDEMA SCREENING:  The patient was assessed using the L-Dex machine today to produce a lymphedema index baseline score. The patient will be reassessed on a regular basis (typically every 3 months) to obtain new L-Dex scores. If the score is > 6.5 points away from his/her baseline score indicating onset of subclinical lymphedema, it will be recommended to wear a compression garment for 4 weeks, 12 hours per day and then be reassessed. If the score continues to be > 6.5 points from baseline at reassessment, we will initiate lymphedema treatment. Assessing in this manner has a 95% rate of preventing clinically significant lymphedema.   L-DEX FLOWSHEETS - 10/31/24 1400       L-DEX LYMPHEDEMA SCREENING   Measurement Type Unilateral    L-DEX MEASUREMENT EXTREMITY Upper Extremity    POSITION  Standing    DOMINANT SIDE Right    At Risk Side Right    BASELINE SCORE (UNILATERAL) 1.6          QUICK DASH SURVEY:  Ian Patton - 10/31/24 0001     Open a tight or new jar No difficulty    Do heavy household chores (wash walls, wash floors) No difficulty    Carry a shopping bag or briefcase No difficulty    Wash your back No difficulty    Use a knife to cut food No difficulty    Recreational activities in which you take some force or impact through your arm, shoulder, or hand (golf, hammering, tennis) No difficulty    During the past week, to what extent has your arm, shoulder or hand problem interfered with your normal social activities with family, friends, neighbors, or groups? Not at all    During the past week, to what extent has your arm, shoulder or hand problem limited your work or other regular daily activities Not at all    Arm, shoulder,  or hand pain. None    Tingling (pins and needles) in your arm, shoulder, or hand None    Difficulty Sleeping No  difficulty    DASH Score 0 %           PATIENT EDUCATION:  Education details: Time spent educating patient on aspects of self-care to maximize post op recovery. Patient was educated on where and how to get a post op compression bra to use to reduce post op edema. Patient was also educated on the use of SOZO screenings and surveillance principles for early identification of lymphedema onset. She was instructed to use the post op pillow in the axilla for pressure and pain relief. Patient educated on lymphedema risk reduction and post op shoulder/posture HEP. Person educated: Patient Education method: Explanation, Demonstration, Handout Education comprehension: Patient verbalized understanding and returned demonstration  HOME EXERCISE PROGRAM: Patient was instructed today in a home exercise program today for post op shoulder range of motion. These included active assist shoulder flexion in sitting, scapular retraction, wall walking with shoulder abduction, and hands behind head external rotation.  She was encouraged to do these twice a day, holding 3 seconds and repeating 5 times when permitted by her physician.   ASSESSMENT:  CLINICAL IMPRESSION: Patient was diagnosed on 10/15/2024 with right grade 2 invasive ductal carcinoma breast cancer. It measures 1.1 cm and is located in the central quadrant. It is ER positive, PR and HER2 negative with a Ki67 of 40% He has no other medical problems. His multidisciplinary medical team met prior to his assessments to determine a recommended treatment plan. He is planning to have a right mastectomy and sentinel node biopsy followed by Oncotype testing and anti-estrogen therapy. He will benefit from a post op PT reassessment to determine needs and from L-Dex screens every 3 months for 2 years to detect subclinical lymphedema.  Pt will benefit from skilled therapeutic intervention to improve on the following deficits: Decreased knowledge of precautions,  impaired UE functional use, pain, decreased ROM, postural dysfunction.   PT treatment/interventions: ADL/self-care home management, pt/family education, therapeutic exercise  REHAB POTENTIAL: Excellent  CLINICAL DECISION MAKING: Stable/uncomplicated  EVALUATION COMPLEXITY: Low   GOALS: Goals reviewed with patient? YES  LONG TERM GOALS: (STG=LTG)    Name Target Date Goal status  1 Pt will be able to verbalize understanding of pertinent lymphedema risk reduction practices relevant to her dx specifically related to skin care.  Baseline:  No knowledge 10/31/2024 Achieved at eval  2 Pt will be able to return demo and/or verbalize understanding of the post op HEP related to regaining shoulder ROM. Baseline:  No knowledge 10/31/2024 Achieved at eval  3 Pt will be able to verbalize understanding of the importance of viewing the post op After Breast CA Class video for further lymphedema risk reduction education and therapeutic exercise.  Baseline:  No knowledge 10/31/2024 Achieved at eval  4 Pt will demo she has regained full shoulder ROM and function post operatively compared to baselines.  Baseline: See objective measurements taken today. 12/21/2024     PLAN:  PT FREQUENCY/DURATION: EVAL and 1 follow up appointment.   PLAN FOR NEXT SESSION: will reassess 3-4 weeks post op to determine needs.   Patient will follow up at outpatient cancer rehab 3-4 weeks following surgery.  If the patient requires physical therapy at that time, a specific plan will be dictated and sent to the referring physician for approval. The patient was educated today on appropriate basic range of motion exercises  to begin post operatively and the importance of viewing the After Breast Cancer class video following surgery.  Patient was educated today on lymphedema risk reduction practices as it pertains to recommendations that will benefit the patient immediately following surgery.  He verbalized good understanding.     Physical Therapy Information for After Breast Cancer Surgery/Treatment:  Lymphedema is a swelling condition that you may be at risk for in your arm if you have lymph nodes removed from the armpit area.  After a sentinel node biopsy, the risk is approximately 5-9% and is higher after an axillary node dissection.  There is treatment available for this condition and it is not life-threatening.  Contact your physician or physical therapist with concerns. You may begin the 4 shoulder/posture exercises (see additional sheet) when permitted by your physician (typically a week after surgery).  If you have drains, you may need to wait until those are removed before beginning range of motion exercises.  A general recommendation is to not lift your arms above shoulder height until drains are removed.  These exercises should be done to your tolerance and gently.  This is not a no pain/no gain type of recovery so listen to your body and stretch into the range of motion that you can tolerate, stopping if you have pain.  If you are having immediate reconstruction, ask your plastic surgeon about doing exercises as he or she may want you to wait. We encourage you to view the After Breast Cancer class video following surgery.  You will learn information related to lymphedema risk, prevention and treatment and additional exercises to regain mobility following surgery.   While undergoing any medical procedure or treatment, try to avoid blood pressure being taken or needle sticks from occurring on the arm on the side of cancer.   This recommendation begins after surgery and continues for the rest of your life.  This may help reduce your risk of getting lymphedema (swelling in your arm). An excellent resource for those seeking information on lymphedema is the National Lymphedema Network's web site. It can be accessed at www.lymphnet.org If you notice swelling in your hand, arm or breast at any time following surgery (even if  it is many years from now), please contact your doctor or physical therapist to discuss this.  Lymphedema can be treated at any time but it is easier for you if it is treated early on.  If you feel like your shoulder motion is not returning to normal in a reasonable amount of time, please contact your surgeon or physical therapist.  [facility] [phone]. [address], [address], [place] [address]  [facility] CLASS After Breast Cancer Class  After Breast Cancer Class is a specially designed exercise class video to assist you in a safe recover after having breast cancer surgery.  In this video you will learn how to get back to full function whether your drains were just removed or if you had surgery a month ago. The video can be viewed on this page: https://www.boyd-meyer.org/ or on YouTube here: https://youtu.az/[email or web address].  Class Goals  Understand specific stretches to improve the flexibility of you chest and shoulder. Learn ways to safely strengthen your upper body and improve your posture. Understand the warning signs of infection and why you may be at risk for an arm infection. Learn about Lymphedema and prevention.  ** You do not need to view this video until after surgery.  Drains should be removed to participate in  the recommended exercises on the video.  Patient was instructed today in a home exercise program today for post op shoulder range of motion. These included active assist shoulder flexion in sitting, scapular retraction, wall walking with shoulder abduction, and hands behind head external rotation.  She was encouraged to do these twice a day, holding 3 seconds and repeating 5 times when permitted by her physician.   Ian Patton, [place] 10/31/24 2:59 PM

## 2024-11-01 ENCOUNTER — Encounter: Payer: Self-pay | Admitting: Physical Therapy

## 2024-11-01 ENCOUNTER — Other Ambulatory Visit: Payer: Self-pay | Admitting: *Deleted

## 2024-11-01 NOTE — Progress Notes (Signed)

## 2024-11-02 ENCOUNTER — Encounter: Payer: Self-pay | Admitting: *Deleted

## 2024-11-02 NOTE — Addendum Note (Signed)
 Addended by: CLAUDENE FACTOR C on: 11/02/2024 11:40 AM   Modules accepted: Orders

## 2024-11-03 ENCOUNTER — Other Ambulatory Visit: Payer: Self-pay

## 2024-11-03 ENCOUNTER — Ambulatory Visit (HOSPITAL_BASED_OUTPATIENT_CLINIC_OR_DEPARTMENT_OTHER): Admitting: Anesthesiology

## 2024-11-03 ENCOUNTER — Encounter (HOSPITAL_BASED_OUTPATIENT_CLINIC_OR_DEPARTMENT_OTHER): Payer: Self-pay | Admitting: Surgery

## 2024-11-03 ENCOUNTER — Ambulatory Visit (HOSPITAL_BASED_OUTPATIENT_CLINIC_OR_DEPARTMENT_OTHER)
Admission: RE | Admit: 2024-11-03 | Discharge: 2024-11-03 | Disposition: A | Discharge status: Home / Self Care | Payer: Self-pay | Attending: Surgery | Admitting: Surgery

## 2024-11-03 ENCOUNTER — Encounter (HOSPITAL_BASED_OUTPATIENT_CLINIC_OR_DEPARTMENT_OTHER)
Admission: RE | Disposition: A | Discharge status: Home / Self Care | Payer: Self-pay | Source: Home / Self Care | Attending: Surgery

## 2024-11-03 DIAGNOSIS — C50121 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: Secondary | ICD-10-CM

## 2024-11-03 DIAGNOSIS — Z87891 Personal history of nicotine dependence: Secondary | ICD-10-CM | POA: Insufficient documentation

## 2024-11-03 DIAGNOSIS — C50921 Malignant neoplasm of unspecified site of right male breast: Secondary | ICD-10-CM | POA: Diagnosis present

## 2024-11-03 HISTORY — PX: MASTECTOMY W/ SENTINEL NODE BIOPSY: SHX2001

## 2024-11-03 SURGERY — MASTECTOMY WITH SENTINEL LYMPH NODE BIOPSY
Anesthesia: General | Site: Breast | Laterality: Right

## 2024-11-03 MED ORDER — MIDAZOLAM HCL (PF) 2 MG/2ML IJ SOLN
2.0000 mg | Freq: Once | INTRAMUSCULAR | Status: AC
  Administered 2024-11-03: 2 mg via INTRAVENOUS

## 2024-11-03 MED ORDER — DEXAMETHASONE SOD PHOSPHATE PF 10 MG/ML IJ SOLN
INTRAMUSCULAR | Status: DC | PRN
  Administered 2024-11-03: 10 mg via INTRAVENOUS

## 2024-11-03 MED ORDER — ONDANSETRON HCL 4 MG/2ML IJ SOLN: 4.0000 mg | Freq: Once | INTRAMUSCULAR | Status: DC | PRN

## 2024-11-03 MED ORDER — MAGTRACE LYMPHATIC TRACER
INTRAMUSCULAR | Status: DC | PRN
  Administered 2024-11-03: 1 mL via INTRAMUSCULAR

## 2024-11-03 MED ORDER — CLONIDINE HCL (ANALGESIA) 100 MCG/ML EP SOLN
EPIDURAL | Status: DC | PRN
  Administered 2024-11-03: 50 ug

## 2024-11-03 MED ORDER — MIDAZOLAM HCL 2 MG/2ML IJ SOLN
INTRAMUSCULAR | Status: AC
  Filled 2024-11-03: qty 2

## 2024-11-03 MED ORDER — ONDANSETRON HCL 4 MG/2ML IJ SOLN
INTRAMUSCULAR | Status: AC
  Filled 2024-11-03: qty 2

## 2024-11-03 MED ORDER — LABETALOL HCL 5 MG/ML IV SOLN
INTRAVENOUS | Status: AC
  Filled 2024-11-03: qty 4

## 2024-11-03 MED ORDER — ACETAMINOPHEN 500 MG PO TABS
ORAL_TABLET | ORAL | Status: AC
  Filled 2024-11-03: qty 2

## 2024-11-03 MED ORDER — OXYCODONE HCL 5 MG/5ML PO SOLN: 5.0000 mg | Freq: Once | ORAL | Status: DC | PRN

## 2024-11-03 MED ORDER — PROPOFOL 10 MG/ML IV BOLUS
INTRAVENOUS | Status: AC
  Filled 2024-11-03: qty 20

## 2024-11-03 MED ORDER — ONDANSETRON HCL 4 MG/2ML IJ SOLN
INTRAMUSCULAR | Status: DC | PRN
  Administered 2024-11-03: 4 mg via INTRAVENOUS

## 2024-11-03 MED ORDER — 0.9 % SODIUM CHLORIDE (POUR BTL) OPTIME
TOPICAL | Status: DC | PRN
  Administered 2024-11-03: 1000 mL

## 2024-11-03 MED ORDER — ROCURONIUM BROMIDE 10 MG/ML (PF) SYRINGE
PREFILLED_SYRINGE | INTRAVENOUS | Status: AC
  Filled 2024-11-03: qty 10

## 2024-11-03 MED ORDER — OXYCODONE HCL 5 MG PO TABS: 5.0000 mg | ORAL_TABLET | Freq: Four times a day (QID) | ORAL | 0 refills | Status: AC | PRN

## 2024-11-03 MED ORDER — CHLORHEXIDINE GLUCONATE CLOTH 2 % EX PADS: 6.0000 | MEDICATED_PAD | Freq: Once | CUTANEOUS | Status: DC

## 2024-11-03 MED ORDER — TRANEXAMIC ACID 1000 MG/10ML IV SOLN
Status: DC | PRN
  Administered 2024-11-03: 3000 mg via TOPICAL

## 2024-11-03 MED ORDER — CELECOXIB 200 MG PO CAPS
ORAL_CAPSULE | ORAL | Status: AC
  Filled 2024-11-03: qty 1

## 2024-11-03 MED ORDER — CELECOXIB 200 MG PO CAPS
200.0000 mg | ORAL_CAPSULE | ORAL | Status: AC
  Administered 2024-11-03: 200 mg via ORAL

## 2024-11-03 MED ORDER — BUPIVACAINE-EPINEPHRINE (PF) 0.5% -1:200000 IJ SOLN
INTRAMUSCULAR | Status: DC | PRN
  Administered 2024-11-03: 30 mL via PERINEURAL

## 2024-11-03 MED ORDER — FENTANYL CITRATE (PF) 100 MCG/2ML IJ SOLN
INTRAMUSCULAR | Status: AC
  Filled 2024-11-03: qty 2

## 2024-11-03 MED ORDER — CEFAZOLIN SODIUM-DEXTROSE 2-4 GM/100ML-% IV SOLN
INTRAVENOUS | Status: AC
  Filled 2024-11-03: qty 100

## 2024-11-03 MED ORDER — EPHEDRINE 5 MG/ML INJ
INTRAVENOUS | Status: AC
  Filled 2024-11-03: qty 5

## 2024-11-03 MED ORDER — FENTANYL CITRATE (PF) 100 MCG/2ML IJ SOLN
INTRAMUSCULAR | Status: DC | PRN
  Administered 2024-11-03: 50 ug via INTRAVENOUS

## 2024-11-03 MED ORDER — PHENYLEPHRINE HCL-NACL 20-0.9 MG/250ML-% IV SOLN
INTRAVENOUS | Status: DC | PRN
  Administered 2024-11-03: 25 ug/min via INTRAVENOUS

## 2024-11-03 MED ORDER — CEFAZOLIN SODIUM-DEXTROSE 2-4 GM/100ML-% IV SOLN
2.0000 g | INTRAVENOUS | Status: AC
  Administered 2024-11-03: 2 g via INTRAVENOUS

## 2024-11-03 MED ORDER — CHLORHEXIDINE GLUCONATE CLOTH 2 % EX PADS
6.0000 | MEDICATED_PAD | Freq: Once | CUTANEOUS | Status: AC
  Administered 2024-11-03: 6 via TOPICAL

## 2024-11-03 MED ORDER — FENTANYL CITRATE (PF) 100 MCG/2ML IJ SOLN
100.0000 ug | Freq: Once | INTRAMUSCULAR | Status: AC
  Administered 2024-11-03: 50 ug via INTRAVENOUS

## 2024-11-03 MED ORDER — PROPOFOL 10 MG/ML IV BOLUS
INTRAVENOUS | Status: DC | PRN
  Administered 2024-11-03: 170 mg via INTRAVENOUS

## 2024-11-03 MED ORDER — OXYCODONE HCL 5 MG PO TABS: 5.0000 mg | ORAL_TABLET | Freq: Once | ORAL | Status: DC | PRN

## 2024-11-03 MED ORDER — AMISULPRIDE (ANTIEMETIC) 5 MG/2ML IV SOLN: 10.0000 mg | Freq: Once | INTRAVENOUS | Status: DC | PRN

## 2024-11-03 MED ORDER — ACETAMINOPHEN 500 MG PO TABS
1000.0000 mg | ORAL_TABLET | ORAL | Status: AC
  Administered 2024-11-03: 1000 mg via ORAL

## 2024-11-03 MED ORDER — FENTANYL CITRATE (PF) 100 MCG/2ML IJ SOLN
25.0000 ug | INTRAMUSCULAR | Status: DC | PRN
  Administered 2024-11-03 (×3): 50 ug via INTRAVENOUS

## 2024-11-03 MED ORDER — LIDOCAINE 2% (20 MG/ML) 5 ML SYRINGE
INTRAMUSCULAR | Status: AC
Start: 2024-11-03 — End: 2024-11-03
  Filled 2024-11-03: qty 5

## 2024-11-03 MED ORDER — LACTATED RINGERS IV SOLN: INTRAVENOUS | Status: DC

## 2024-11-03 MED ORDER — EPHEDRINE SULFATE (PRESSORS) 25 MG/5ML IV SOSY
PREFILLED_SYRINGE | INTRAVENOUS | Status: DC | PRN
  Administered 2024-11-03: 10 mg via INTRAVENOUS
  Administered 2024-11-03: 5 mg via INTRAVENOUS
  Administered 2024-11-03: 10 mg via INTRAVENOUS

## 2024-11-03 MED ORDER — LABETALOL HCL 5 MG/ML IV SOLN
10.0000 mg | Freq: Once | INTRAVENOUS | Status: AC
  Administered 2024-11-03: 10 mg via INTRAVENOUS

## 2024-11-03 MED ORDER — LIDOCAINE 2% (20 MG/ML) 5 ML SYRINGE
INTRAMUSCULAR | Status: DC | PRN
Start: 2024-11-03 — End: 2024-11-03
  Administered 2024-11-03: 80 mg via INTRAVENOUS

## 2024-11-03 SURGICAL SUPPLY — 58 items
BENZOIN TINCTURE PRP APPL 2/3 (GAUZE/BANDAGES/DRESSINGS) IMPLANT
BINDER BREAST LRG (GAUZE/BANDAGES/DRESSINGS) IMPLANT
BINDER BREAST MEDIUM (GAUZE/BANDAGES/DRESSINGS) IMPLANT
BINDER BREAST XLRG (GAUZE/BANDAGES/DRESSINGS) IMPLANT
BINDER BREAST XXLRG (GAUZE/BANDAGES/DRESSINGS) IMPLANT
BIOPATCH RED 1 DISK 7.0 (GAUZE/BANDAGES/DRESSINGS) IMPLANT
BLADE HEX COATED 2.75 (ELECTRODE) ×2 IMPLANT
BLADE SURG 10 STRL SS (BLADE) ×2 IMPLANT
BLADE SURG 15 STRL LF DISP TIS (BLADE) ×2 IMPLANT
BNDG ELASTIC 6INX 5YD STR LF (GAUZE/BANDAGES/DRESSINGS) IMPLANT
CANISTER SUCT 1200ML W/VALVE (MISCELLANEOUS) ×2 IMPLANT
CHLORAPREP W/TINT 26 (MISCELLANEOUS) ×2 IMPLANT
CLIP APPLIE 11 MED OPEN (CLIP) IMPLANT
CLIP APPLIE 9.375 MED OPEN (MISCELLANEOUS) ×2 IMPLANT
COVER BACK TABLE 60X90IN (DRAPES) ×2 IMPLANT
COVER MAYO STAND STRL (DRAPES) ×2 IMPLANT
COVER PROBE CYLINDRICAL 5X96 (MISCELLANEOUS) ×2 IMPLANT
DERMABOND ADVANCED .7 DNX12 (GAUZE/BANDAGES/DRESSINGS) ×4 IMPLANT
DRAIN CHANNEL 19F RND (DRAIN) ×2 IMPLANT
DRAPE LAPAROSCOPIC ABDOMINAL (DRAPES) ×2 IMPLANT
DRAPE UTILITY XL STRL (DRAPES) IMPLANT
DRSG TEGADERM 2-3/8X2-3/4 SM (GAUZE/BANDAGES/DRESSINGS) IMPLANT
DRSG TEGADERM 4X10 (GAUZE/BANDAGES/DRESSINGS) ×2 IMPLANT
ELECTRODE REM PT RTRN 9FT ADLT (ELECTROSURGICAL) ×2 IMPLANT
EVACUATOR SILICONE 100CC (DRAIN) ×2 IMPLANT
GAUZE PAD ABD 8X10 STRL (GAUZE/BANDAGES/DRESSINGS) IMPLANT
GAUZE SPONGE 4X4 12PLY STRL LF (GAUZE/BANDAGES/DRESSINGS) IMPLANT
GLOVE BIOGEL PI IND STRL 8 (GLOVE) ×2 IMPLANT
GLOVE ECLIPSE 8.0 STRL XLNG CF (GLOVE) ×2 IMPLANT
GOWN STRL REUS W/ TWL LRG LVL3 (GOWN DISPOSABLE) ×4 IMPLANT
GOWN STRL REUS W/ TWL XL LVL3 (GOWN DISPOSABLE) ×2 IMPLANT
LIGHT WAVEGUIDE WIDE FLAT (MISCELLANEOUS) IMPLANT
NDL HYPO 25X1 1.5 SAFETY (NEEDLE) ×4 IMPLANT
NDL SAFETY ECLIPSE 18X1.5 (NEEDLE) ×2 IMPLANT
NEEDLE HYPO 25X1 1.5 SAFETY (NEEDLE) ×2 IMPLANT
PACK BASIN DAY SURGERY FS (CUSTOM PROCEDURE TRAY) ×2 IMPLANT
PENCIL SMOKE EVACUATOR (MISCELLANEOUS) ×2 IMPLANT
PIN SAFETY STERILE (MISCELLANEOUS) ×2 IMPLANT
SLEEVE SCD COMPRESS KNEE MED (STOCKING) ×2 IMPLANT
SOLN 0.9% NACL POUR BTL 1000ML (IV SOLUTION) IMPLANT
SPIKE FLUID TRANSFER (MISCELLANEOUS) IMPLANT
SPONGE T-LAP 18X18 ~~LOC~~+RFID (SPONGE) ×2 IMPLANT
SPONGE T-LAP 4X18 ~~LOC~~+RFID (SPONGE) IMPLANT
STAPLER SKIN PROX WIDE 3.9 (STAPLE) ×2 IMPLANT
STRIP CLOSURE SKIN 1/2X4 (GAUZE/BANDAGES/DRESSINGS) IMPLANT
SUT CHROMIC 3 0 SH 27 (SUTURE) IMPLANT
SUT ETHILON 2 0 FS 18 (SUTURE) ×2 IMPLANT
SUT MNCRL AB 3-0 PS2 18 (SUTURE) ×2 IMPLANT
SUT MON AB 4-0 PC3 18 (SUTURE) IMPLANT
SUT SILK 2 0 PERMA HAND 18 BK (SUTURE) ×2 IMPLANT
SUT SILK 2 0 SH (SUTURE) IMPLANT
SUT VIC AB 3-0 54X BRD REEL (SUTURE) ×2 IMPLANT
SUT VICRYL 3-0 CR8 SH (SUTURE) ×4 IMPLANT
SYR CONTROL 10ML LL (SYRINGE) ×4 IMPLANT
TOWEL GREEN STERILE FF (TOWEL DISPOSABLE) ×4 IMPLANT
TRACER MAGTRACE VIAL (MISCELLANEOUS) IMPLANT
TUBE CONNECTING 20X1/4 (TUBING) ×2 IMPLANT
YANKAUER SUCT BULB TIP NO VENT (SUCTIONS) ×2 IMPLANT

## 2024-11-03 NOTE — Anesthesia Procedure Notes (Signed)
 Anesthesia Regional Block: Pectoralis block   Pre-Anesthetic Checklist: , timeout performed,  Correct Patient, Correct Site, Correct Laterality,  Correct Procedure, Correct Position, site marked,  Risks and benefits discussed,  Surgical consent,  Pre-op evaluation,  At surgeon's request and post-op pain management  Laterality: Right  Prep: chloraprep       Needles:  Injection technique: Single-shot  Needle Type: Echogenic Needle     Needle Length: 9cm  Needle Gauge: 21     Additional Needles:   Procedures:,,,, ultrasound used (permanent image in chart),,    Narrative:  Start time: 11/03/2024 11:50 AM End time: 11/03/2024 11:57 AM Injection made incrementally with aspirations every 5 mL.  Performed by: Personally  Anesthesiologist: Corinne Garnette BRAVO, MD  Additional Notes: No pain on injection. No increased resistance to injection. Injection made in 5cc increments.  Good needle visualization.  Patient tolerated procedure well.

## 2024-11-03 NOTE — Anesthesia Procedure Notes (Signed)
 Procedure Name: LMA Insertion Date/Time: 11/03/2024 1:06 PM  Performed by: Frost Kayla MATSU, CRNAPre-anesthesia Checklist: Patient identified, Emergency Drugs available, Suction available and Patient being monitored Patient Re-evaluated:Patient Re-evaluated prior to induction Oxygen Delivery Method: Circle system utilized Preoxygenation: Pre-oxygenation with 100% oxygen Induction Type: IV induction LMA: LMA inserted LMA Size: 4.0 Number of attempts: 1 Placement Confirmation: positive ETCO2 Tube secured with: Tape Dental Injury: Teeth and Oropharynx as per pre-operative assessment

## 2024-11-03 NOTE — Anesthesia Postprocedure Evaluation (Signed)
 Anesthesia Post Note  Patient: Ian Patton  Procedure(s) Performed: MASTECTOMY WITH SENTINEL LYMPH NODE BIOPSY (Right: Breast)     Patient location during evaluation: PACU Anesthesia Type: General Level of consciousness: awake and alert Pain management: pain level controlled Vital Signs Assessment: post-procedure vital signs reviewed and stable Respiratory status: spontaneous breathing, nonlabored ventilation and respiratory function stable Cardiovascular status: blood pressure returned to baseline and stable Postop Assessment: no apparent nausea or vomiting Anesthetic complications: no   No notable events documented.  Last Vitals:  Vitals:   11/03/24 1509 11/03/24 1515  BP: (!) 145/97 (!) 157/89  Pulse: 65 65  Resp: (!) 22 20  Temp:  36.6 C  SpO2: 96% 99%    Last Pain:  Vitals:   11/03/24 1509  TempSrc:   PainSc: 4                  Garnette FORBES Skillern

## 2024-11-03 NOTE — Progress Notes (Signed)
Assisted Dr. Turk with right, pectoralis, ultrasound guided block. Side rails up, monitors on throughout procedure. See vital signs in flow sheet. Tolerated Procedure well. 

## 2024-11-03 NOTE — Op Note (Signed)
 Preoperative diagnosis: Stage I right breast cancer centrally located  Postoperative diagnosis: Same  Procedure: Right simple mastectomy with right axillary deep sentinel lymph node mapping using mag trace injection  Surgeon: Debby Shipper, MD  Anesthesia: LMA with right pectoral block  EBL: 30 cc  Drains: 19 round  Specimen: Right breast specimen including the nipple areolar complex and skin to pathology with 2 right axillary sentinel nodes  Indications for procedure: The patient is a 68 year old male with right breast cancer.  This diagnosis after he felt a mass under his right nipple.  Workup showed invasive mammary carcinoma.  After reviewing his options he presents for right simple mastectomy with right axillary sentinel lymph node mapping.The surgical and non surgical options have been discussed with the patient.  Risks of surgery include bleeding,  Infection,  Flap necrosis,  Tissue loss,  Chronic pain, death, Numbness,  And the need for additional procedures.  Reconstruction options also have been discussed with the patient as well.  The patient agrees to proceed.     Description of procedure: The patient was met in the holding area questions were answered.  The right breast was marked as the correct site.  He underwent a pectoral block for anesthesia.  He was then taken back to the operative room placed supine upon the operating table.  After induction of general anesthesia, 2 cc of mag trace were injected in the right upper outer quadrant of breast under sterile conditions after timeout was performed.  The right breast was prepped and draped in sterile fashion.  A second timeout performed.  A fishmouth incision was made above and below the nipple areolar complex and skin flaps were taken to the clavicle, sternum, inframammary fold and then to the lateral axilla.  All breast tissue was dissected off the skin flaps performed without difficulty.  Once lateral attachments were identified  the breast was divided and the tissue was oriented with a single stitch and sent to pathology.  Sentimag probe was used.  There is significant uptake in the right level 1 deep lymph node basin in the right axilla.  There are 3 nodes removed that had significant spike in signal.  The background counts were noted today and were much lower.  There is no other adenopathy.  The nodes appear grossly normal.  This was sent a second specimen to sentinel lymph nodes.  TXA sponge placed for 5 minutes.  Hemostasis was excellent.  19 round drain was placed through the inferior skin flap and secured with 2-0 nylon.  Skin flaps were then closed with combination of 3-0 Vicryl and 4 Monocryl.  Dermabond applied.  Ace wrap placed.  All counts found to be correct.  The patient was awoke extubated taken to recovery in satisfactory condition.

## 2024-11-03 NOTE — Discharge Instructions (Addendum)
 [facility] [phone]  MASTECTOMY: POST OP INSTRUCTIONS  Always review your discharge instruction sheet given to you by the facility where your surgery was performed. IF YOU HAVE DISABILITY OR FAMILY LEAVE FORMS, YOU MUST BRING THEM TO THE OFFICE FOR PROCESSING.   DO NOT GIVE THEM TO YOUR DOCTOR. A prescription for pain medication may be given to you upon discharge.  Take your pain medication as prescribed, if needed.  If narcotic pain medicine is not needed, then you may take acetaminophen (Tylenol) or ibuprofen (Advil) as needed. Take your usually prescribed medications unless otherwise directed. If you need a refill on your pain medication, please contact your pharmacy.  They will contact our office to request authorization.  Prescriptions will not be filled after 5pm or on week-ends. You should follow a light diet the first few days after arrival home, such as soup and crackers, etc.  Resume your normal diet the day after surgery. Most patients will experience some swelling and bruising on the chest and underarm.  Ice packs will help.  Swelling and bruising can take several days to resolve.  It is common to experience some constipation if taking pain medication after surgery.  Increasing fluid intake and taking a stool softener (such as Colace) will usually help or prevent this problem from occurring.  A mild laxative (Milk of Magnesia or Miralax) should be taken according to package instructions if there are no bowel movements after 48 hours. Unless discharge instructions indicate otherwise, leave your bandage dry and in place until your next appointment in 3-5 days.  You may take a limited sponge bath.  No tube baths or showers until the drains are removed.  You may have steri-strips (small skin tapes) in place directly over the incision.  These strips should be left on the skin for 7-10 days.  If your surgeon used skin glue on the incision, you may shower in 24 hours.   The glue will flake off over the next 2-3 weeks.  Any sutures or staples will be removed at the office during your follow-up visit. DRAINS:  If you have drains in place, it is important to keep a list of the amount of drainage produced each day in your drains.  Before leaving the hospital, you should be instructed on drain care.  Call our office if you have any questions about your drains. ACTIVITIES:  You may resume regular (light) daily activities beginning the next day--such as daily self-care, walking, climbing stairs--gradually increasing activities as tolerated.  You may have sexual intercourse when it is comfortable.  Refrain from any heavy lifting or straining until approved by your doctor. You may drive when you are no longer taking prescription pain medication, you can comfortably wear a seatbelt, and you can safely maneuver your car and apply brakes. RETURN TO WORK:  __________________________________________________________ Ian Patton should see your doctor in the office for a follow-up appointment approximately 3-5 days after your surgery.  Your doctor's nurse will typically make your follow-up appointment when she calls you with your pathology report.  Expect your pathology report 2-3 business days after your surgery.  You may call to check if you do not hear from us  after three days.   OTHER INSTRUCTIONS: ______________________________________________________________________________________________ ____________________________________________________________________________________________ WHEN TO CALL YOUR DOCTOR: Fever over 101.0 Nausea and/or vomiting Extreme swelling or bruising Continued bleeding from incision. Increased pain, redness, or drainage from the incision. The clinic staff is available to answer your questions during regular business hours.  Please don't hesitate  to call and ask to speak to one of the nurses for clinical concerns.  If you have a medical emergency, go to the  nearest emergency room or call 911.  A surgeon from [facility] is always on call at the hospital. [address], [address], [place], [place]  [address] ? [address] [address], [place], [place]   [address] [phone] ? [phone] ? FAX [phone] Web site: [email or web address]    Post Anesthesia Home Care Instructions  Activity: Get plenty of rest for the remainder of the day. A responsible individual must stay with you for 24 hours following the procedure.  For the next 24 hours, DO NOT: -Drive a car -Advertising copywriter -Drink alcoholic beverages -Take any medication unless instructed by your physician -Make any legal decisions or sign important papers.  Meals: Start with liquid foods such as gelatin or soup. Progress to regular foods as tolerated. Avoid greasy, spicy, heavy foods. If nausea and/or vomiting occur, drink only clear liquids until the nausea and/or vomiting subsides. Call your physician if vomiting continues.  Special Instructions/Symptoms: Your throat may feel dry or sore from the anesthesia or the breathing tube placed in your throat during surgery. If this causes discomfort, gargle with warm salt water. The discomfort should disappear within 24 hours.  If you had a scopolamine patch placed behind your ear for the management of post- operative nausea and/or vomiting:  1. The medication in the patch is effective for 72 hours, after which it should be removed.  Wrap patch in a tissue and discard in the trash. Wash hands thoroughly with soap and water. 2. You may remove the patch earlier than 72 hours if you experience unpleasant side effects which may include dry mouth, dizziness or visual disturbances. 3. Avoid touching the patch. Wash your hands with soap and water after contact with the patch.     About my Jackson-Pratt Bulb Drain  What is a Jackson-Pratt bulb? A Jackson-Pratt is a soft, round device used to collect drainage. It is connected to a long, thin  drainage catheter, which is held in place by one or two small stiches near your surgical incision site. When the bulb is squeezed, it forms a vacuum, forcing the drainage to empty into the bulb.  Emptying the Jackson-Pratt bulb- To empty the bulb: 1. Release the plug on the top of the bulb. 2. Pour the bulb's contents into a measuring container which your nurse will provide. 3. Record the time emptied and amount of drainage. Empty the drain(s) as often as your     doctor or nurse recommends.  Date                  Time                    Amount (Drain 1)                 Amount (Drain 2)  _____________________________________________________________________  _____________________________________________________________________  _____________________________________________________________________  _____________________________________________________________________  _____________________________________________________________________  _____________________________________________________________________  _____________________________________________________________________  _____________________________________________________________________  Squeezing the Jackson-Pratt Bulb- To squeeze the bulb: 1. Make sure the plug at the top of the bulb is open. 2. Squeeze the bulb tightly in your fist. You will hear air squeezing from the bulb. 3. Replace the plug while the bulb is squeezed. 4. Use a safety pin to attach the bulb to your clothing. This will keep the catheter from     pulling at the bulb insertion site.  When  to call your doctor- Call your doctor if: Drain site becomes red, swollen or hot. You have a fever greater than 101 degrees F. There is oozing at the drain site. Drain falls out (apply a guaze bandage over the drain hole and secure it with tape). Drainage increases daily not related to activity patterns. (You will usually have more drainage when you are active than  when you are resting.) Drainage has a bad odor.    No ibuprofen/NSAIDs or tylenol until after 545pm

## 2024-11-03 NOTE — H&P (Signed)
 History of Present Illness: Ian Patton is a 68 y.o. male who is seen today as an office consultation for evaluation of New Consultation  Patient presents for evaluation of a painless nodule noted right central breast. He noticed it a few weeks ago on vacation. He underwent workup which included an ultrasound, mammogram and core biopsy which showed grade 2 invasive mammary carcinoma receptors and subtype pending. This measured 1.1 cm location with central right breast. No family history of breast cancer of any cancer he is aware of. He is quite healthy. He has some bruising at the biopsy site.  Review of Systems: A complete review of systems was obtained from the patient. I have reviewed this information and discussed as appropriate with the patient. See HPI as well for other ROS.    Medical History: No past medical history on file.  There is no problem list on file for this patient.  No past surgical history on file.   No Known Allergies  Current Outpatient Medications on File Prior to Visit  Medication Sig Dispense Refill  ezetimibe (ZETIA) 10 mg tablet Take 10 mg by mouth once daily  aspirin 81 MG EC tablet Take 81 mg by mouth once daily   No current facility-administered medications on file prior to visit.   No family history on file.   Social History   Tobacco Use  Smoking Status Not on file  Smokeless Tobacco Not on file    Social History   Socioeconomic History  Marital status: Married   Social Drivers of Health   Housing Stability: Unknown (10/17/2024)  Housing Stability Vital Sign  Homeless in the Last Year: No   Objective:   Vitals:  10/17/24 1010 10/17/24 1011  BP: 134/89  Pulse: 87  Temp: 36.8 C (98.3 F)  SpO2: 99%  Weight: 81.6 kg (179 lb 12.8 oz)  Height: 172.7 cm (5' 8)  PainSc: 0-No pain   Body mass index is 27.34 kg/m.  Physical Exam Vitals reviewed. Exam conducted with a chaperone present.  Cardiovascular:  Rate and Rhythm:  Normal rate.  Pulmonary:  Effort: Pulmonary effort is normal.  Chest:  Breasts: Left: Normal.   Comments: Bruising central right breast noted. Mobile mass noted measuring a centimeter. Musculoskeletal:  General: Normal range of motion.  Lymphadenopathy:  Upper Body:  Right upper body: No supraclavicular or axillary adenopathy.  Left upper body: No supraclavicular or axillary adenopathy.  Skin: General: Skin is warm.  Neurological:  General: No focal deficit present.  Mental Status: He is alert.     Labs, Imaging and Diagnostic Testing: Mammogram ultrasound showed a 1.1 cm mass central right breast core biopsy proven to be invasive mammary carcinoma with receptors and subtype pending. Imaging shows no axillary adenopathy. Assessment and Plan:   Diagnoses and all orders for this visit:  Breast cancer, stage 1, right (CMS/HHS-HCC)   Reviewed breast cancer males  Recommend right simple mastectomy with right axillary sent lymph node mapping. Pros and cons of this reviewed with the patient. Long-term expectations and expected recovery reviewed. Risk of bleeding, infection, flap necrosis, pain, swelling, cosmetic deformity, lymphedema, shoulder stiffness, injury to underlying blood vessels, nerves, blood biotics reviewed. He is quite thin and has very little breast tissue therefore breast conservation serves no purpose from the standpoint of cosmesis in his case. Refer to medical oncology  Consider genetics   DEBBY CURTISTINE SHIPPER, MD

## 2024-11-03 NOTE — Transfer of Care (Signed)
 Immediate Anesthesia Transfer of Care Note  Patient: Ian Patton  Procedure(s) Performed: MASTECTOMY WITH SENTINEL LYMPH NODE BIOPSY (Right: Breast)  Patient Location: PACU  Anesthesia Type:General  Level of Consciousness: drowsy and patient cooperative  Airway & Oxygen Therapy: Patient Spontanous Breathing and Patient connected to face mask oxygen  Post-op Assessment: Report given to RN and Post -op Vital signs reviewed and stable  Post vital signs: Reviewed and stable  Last Vitals:  Vitals Value Taken Time  BP 150/85 11/03/24 14:22  Temp    Pulse 79 11/03/24 14:24  Resp 15 11/03/24 14:24  SpO2 99 % 11/03/24 14:24  Vitals shown include unfiled device data.  Last Pain:  Vitals:   11/03/24 1140  TempSrc: Temporal  PainSc: 0-No pain      Patients Stated Pain Goal: 4 (11/03/24 1140)  Complications: No notable events documented.

## 2024-11-03 NOTE — Interval H&P Note (Signed)
 History and Physical Interval Note:  11/03/2024 12:29 PM  Ian Patton  has presented today for surgery, with the diagnosis of RIGHT BREAST CANCER.  The various methods of treatment have been discussed with the patient and family. After consideration of risks, benefits and other options for treatment, the patient has consented to  Procedure(s) with comments: MASTECTOMY WITH SENTINEL LYMPH NODE BIOPSY (Right) - RIGHT SIMPLE MASTECTOMY RIGHT SENTINEL LYMPH NODE MAPPING as a surgical intervention.  The patient's history has been reviewed, patient examined, no change in status, stable for surgery.  I have reviewed the patient's chart and labs.  Questions were answered to the patient's satisfaction.     Avanell Banwart A Laterria Lasota

## 2024-11-03 NOTE — Anesthesia Preprocedure Evaluation (Addendum)
 Anesthesia Evaluation  Patient identified by MRN, date of birth, ID band Patient awake    Reviewed: Allergy & Precautions, NPO status , Patient's Chart, lab work & pertinent test results  Airway Mallampati: II  TM Distance: >3 FB Neck ROM: Full    Dental  (+) Teeth Intact, Dental Advisory Given   Pulmonary former smoker   Pulmonary exam normal breath sounds clear to auscultation       Cardiovascular Normal cardiovascular exam+ dysrhythmias (PVCs)  Rhythm:Regular Rate:Normal     Neuro/Psych negative neurological ROS     GI/Hepatic negative GI ROS, Neg liver ROS,,,  Endo/Other  negative endocrine ROS    Renal/GU negative Renal ROS     Musculoskeletal negative musculoskeletal ROS (+)    Abdominal   Peds  Hematology negative hematology ROS (+)   Anesthesia Other Findings Day of surgery medications reviewed with the patient.  RIGHT BREAST CANCER  Reproductive/Obstetrics                              Anesthesia Physical Anesthesia Plan  ASA: 2  Anesthesia Plan: General   Post-op Pain Management: Regional block*, Tylenol PO (pre-op)* and Celebrex PO (pre-op)*   Induction: Intravenous  PONV Risk Score and Plan: 2 and Midazolam, Dexamethasone and Ondansetron  Airway Management Planned: LMA  Additional Equipment:   Intra-op Plan:   Post-operative Plan: Extubation in OR  Informed Consent: I have reviewed the patients History and Physical, chart, labs and discussed the procedure including the risks, benefits and alternatives for the proposed anesthesia with the patient or authorized representative who has indicated his/her understanding and acceptance.     Dental advisory given  Plan Discussed with: CRNA  Anesthesia Plan Comments:         Anesthesia Quick Evaluation

## 2024-11-04 ENCOUNTER — Encounter (HOSPITAL_BASED_OUTPATIENT_CLINIC_OR_DEPARTMENT_OTHER): Payer: Self-pay | Admitting: Surgery

## 2024-11-07 ENCOUNTER — Ambulatory Visit: Payer: Self-pay | Admitting: Surgery

## 2024-11-07 LAB — SURGICAL PATHOLOGY

## 2024-11-08 ENCOUNTER — Telehealth: Payer: Self-pay | Admitting: *Deleted

## 2024-11-08 NOTE — Telephone Encounter (Signed)
 Ordered oncotype per Dr. Gudena. Requisition sent to [facility] [employer]

## 2024-11-18 ENCOUNTER — Encounter (HOSPITAL_COMMUNITY): Payer: Self-pay

## 2024-11-21 ENCOUNTER — Encounter (HOSPITAL_COMMUNITY): Payer: Self-pay

## 2024-11-21 ENCOUNTER — Encounter: Payer: Self-pay | Admitting: *Deleted

## 2024-11-21 NOTE — Progress Notes (Signed)
 Discussed oncotype result with Dr. Gudena and he stated to call patient with information he will not need chemotherapy. Has follow up appt with Dr. Gudena next week. Called patient and he stated he had already read on My Chart his result and he was pleased. States he is recovering from surgery well. No other questions/concerns at this time.

## 2024-11-21 NOTE — Progress Notes (Signed)
 Oncotype result 23/8%. Send information via inbasket to Dr. Vanderbilt and Dr. Gudena. Patient has appt with Dr. Gudena next week to discuss.

## 2024-11-23 ENCOUNTER — Inpatient Hospital Stay: Admitting: Hematology and Oncology

## 2024-11-23 NOTE — Therapy (Signed)
 OUTPATIENT PHYSICAL THERAPY BREAST CANCER POST OP FOLLOW UP   Patient Name: Ian Patton MRN: [id number] DOB:07-13-1956, 68 y.o., male Today's Date: 11/24/2024  END OF SESSION:  PT End of Session - 11/24/24 1053     Visit Number 2    Number of Visits 10    Date for Recertification  02/02/25    PT Start Time 1055    PT Stop Time 1150    PT Time Calculation (min) 55 min    Activity Tolerance Patient tolerated treatment well    Behavior During Therapy WFL for tasks assessed/performed          Past Medical History:  Diagnosis Date   Hyperlipidemia    Lumbar stenosis    Near syncope    PVC's (premature ventricular contractions)    Past Surgical History:  Procedure Laterality Date   MASTECTOMY W/ SENTINEL NODE BIOPSY Right 11/03/2024   Procedure: MASTECTOMY WITH SENTINEL LYMPH NODE BIOPSY;  Surgeon: Vanderbilt Ned, MD;  Location: [facility];  Service: General;  Laterality: Right;  RIGHT SIMPLE MASTECTOMY RIGHT SENTINEL LYMPH NODE MAPPING   Patient Active Problem List   Diagnosis Date Noted   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 Ned Vanderbilt  REFERRING DIAG: Right breast cancer  THERAPY DIAG:  Abnormal posture  Malignant neoplasm of central portion of breast in male, estrogen receptor positive, unspecified laterality [facility])  Aftercare following surgery for neoplasm  Rationale for Evaluation and Treatment: Rehabilitation  ONSET DATE: 11/03/2024  SUBJECTIVE:                                                                                                                                                                                           SUBJECTIVE STATEMENT: Patient reports he  underwent a right mastectomy and sentinel node biopsy (2 negative nodes) on 11/03/2024. Drains were removed on 11/15/2024. His Oncotype score was 23 so no chemotherapy is needed. He reports he has returned to [employer] but has noticed since surgery, he feels faint when walking on the treadmill.  PERTINENT HISTORY:  Patient was diagnosed on 10/15/2024 with right grade 2 invasive ductal carcinoma breast cancer. He underwent a right mastectomy and sentinel node biopsy (2 negative nodes) on 11/03/2024. It is ER positive, PR and HER2 negative with a Ki67 of 40% He has no other medical problems.   PATIENT GOALS:  Reassess how my recovery is going related to arm function, pain, and swelling.  PAIN:  Are you having pain? Yes: NPRS scale: 4/10 Pain location: Right axilla and just inferior to axilla Pain description: burning Aggravating factors: reaching Relieving factors: rest  PRECAUTIONS: Recent Surgery, right UE Lymphedema risk  RED FLAGS: None   ACTIVITY LEVEL / LEISURE: Bikes regularly up to 60 miles at a time; hikes some - prior to his diagnosis was doing some cardio 5x/week    OBJECTIVE:   PATIENT SURVEYS:  QUICK DASH:  Quick Dash - 11/24/24 0001     Open a tight or new jar No difficulty    Do heavy household chores (wash walls, wash floors) No difficulty    Carry a shopping bag or briefcase No difficulty    Wash your back No difficulty    Use a knife to cut food No difficulty    Recreational activities in which you take some force or impact through your arm, shoulder, or hand (golf, hammering, tennis) No difficulty    During the past week, to what extent has your arm, shoulder or hand problem interfered with your normal social activities with family, friends, neighbors, or groups? Not at all    During the past week, to what extent has your arm, shoulder or hand problem limited your work or other regular daily activities Slightly    Arm, shoulder, or hand pain. Moderate    Tingling  (pins and needles) in your arm, shoulder, or hand None    Difficulty Sleeping Mild difficulty    DASH Score 9.09 %           OBSERVATIONS: BP 166/101; incision appears to be healing very well. Removed adhesive with adhesive remover from area that was previously covered by bandage. Glue still present at incision. No signs of edema or redness.  POSTURE:  Forward head, rounded shoulders  LYMPHEDEMA ASSESSMENT:   UPPER EXTREMITY AROM/PROM:   A/PROM RIGHT   eval   RIGHT 11/24/2024  Shoulder extension 59 52  Shoulder flexion 148 145  Shoulder abduction 161 147  Shoulder internal rotation 77 57  Shoulder external rotation 90 90                          (Blank rows = not tested)   A/PROM LEFT   eval  Shoulder extension 63  Shoulder flexion 133  Shoulder abduction 157  Shoulder internal rotation 75  Shoulder external rotation 88                          (Blank rows = not tested) CERVICAL AROM: All within normal limits   UPPER EXTREMITY STRENGTH: WNL   LYMPHEDEMA ASSESSMENTS (in cm):    LANDMARK RIGHT   eval RIGHT 11/24/2024  10 cm proximal to olecranon process from proximal aspect of olecranon 28.7 28.2  Olecranon process 27.1 26.9  10 cm proximal to ulnar styloid process from proximal aspect of styloid process 24 24.2  Just distal to ulnar styloid process 18 17.8  Across hand at thumb web space 21.7 21.7  At base of 2nd digit 7.8 7.7  (Blank rows = not tested)   LANDMARK LEFT   eval LEFT 11/24/2024  10 cm proximal to olecranon process from proximal aspect of olecranon 26.9 27.4   Olecranon process 26.2 25.7  10 cm proximal to ulnar styloid process from proximal aspect of styloid process 23 23  Just distal to ulnar styloid process 18 17.6  Across hand at thumb web space 22.1 21.4  At base of 2nd digit 7.1 7  (Blank rows = not tested) Surgery type/Date: 11/03/2024 right mastectomy and sentinel node biopsy Number of lymph nodes removed: 2 Current/past  treatment (chemo, radiation, hormone therapy): none Other symptoms:  Heaviness/tightness No Pain Yes Pitting edema No Infections No Decreased scar mobility Yes Stemmer sign No  PATIENT EDUCATION:  Education details: HEP and closed chain shoulder flexion and abduction Person educated: Patient Education method: Programmer, Multimedia, Demonstration, Tactile cues, Verbal cues, and Handouts Education comprehension: verbalized understanding and returned demonstration  HOME EXERCISE PROGRAM: Reviewed previously given post op HEP. Issued closed chain HEP of shoulder flexion and abduction too.  ASSESSMENT:  CLINICAL IMPRESSION: Patient is healing well s/p right mastectomy and sentinel node biopsy (2 negative nodes) on 11/03/2024. He has regained most of his ROM but is limited with abduction and has significant scar tissue at the incision site that would benefit from PT. He reported symptoms of SOB, cough, feeling faint, and some right lower rib pain. Referred him to the cancer center today for bloodwork to rule out any concerning pathology (PE, anemia?) as his symptoms do not seem like typical post op symptoms for a very healthy man. He will benefit from a few visits of PT to regain full ROM and address scar tissue.  Pt will benefit from skilled therapeutic intervention to improve on the following deficits: Decreased knowledge of precautions, impaired UE functional use, pain, decreased ROM, postural dysfunction.   PT treatment/interventions: ADL/Self care home management, [phone]- PT Re-evaluation, 97110-Therapeutic exercises, 97530- Therapeutic activity, 97535- Self Care, [id number]- Manual therapy, Patient/Family education, and Scar mobilization   GOALS: Goals reviewed with patient? Yes  GOALS MET AT EVAL:  GOALS Name Target Date Goal status  1 Pt will be able to verbalize understanding of pertinent lymphedema risk reduction practices relevant to her dx specifically related to skin care.  Baseline:  No  knowledge Eval Achieved at eval  2 Pt will be able to return demo and/or verbalize understanding of the post op HEP related to regaining shoulder ROM. Baseline:  No knowledge Eval Achieved at eval  3 Pt will be able to verbalize understanding of the importance of viewing the post op After Breast CA Class video for further lymphedema risk reduction education and therapeutic exercise.  Baseline:  No knowledge Eval Achieved at eval   LONG TERM GOALS:  (STG=LTG)  GOALS Name Target Date  Goal status  1 Pt will demonstrate he has regained full shoulder ROM and function post operatively compared to baselines.  Baseline: 02/02/2025 Ongoing  2 Patient will improve his DASH score to return to 0% baseline status for improved overall UE function. 02/02/2025 INITIAL  3 Patient will increase right shoulder abduction to >/= 160 degrees for increased ease reaching. 02/02/2025 INITIAL  4 Patient will report >/= 50% improvement in pain in the axillary region for improved tolerance of daily tasks. 02/03/2024 INITIAL     PLAN:  PT FREQUENCY/DURATION: 2x/week for 4 weeks  PLAN FOR NEXT SESSION: Begin PROM focused on end ROM flexion and abduction; AROM exercises to achieve full ROM; scar massage if all glue is gone and incision appears completely closed.   [facility]  [address], [address]  [place] [address]  [phone]  After Breast Cancer Class Video It is recommended you view the ABC class video to be educated on lymphedema risk reduction. This video  lasts for about 30 minutes. It can be viewed on our website here: [email or web address]  Scar massage You can begin gentle scar massage to you incision sites. Gently place one hand on the incision and move the skin (without sliding on the skin) in various directions. Do this for a few minutes and then you can gently massage either coconut oil or vitamin E cream  into the scars. YOU CAN BEGIN THIS ONCE THE GLUE IS ALL GONE.  Home exercise Program Continue doing the exercises you were given until you feel like you can do them without feeling any tightness at the end.   Walking Program Studies show that 30 minutes of walking per day (fast enough to elevate your heart rate) can significantly reduce the risk of a cancer recurrence. If you can't walk due to other medical reasons, we encourage you to find another activity you could do (like a stationary bike or water exercise).  Posture After breast cancer surgery, people frequently sit with rounded shoulders posture because it puts their incisions on slack and feels better. If you sit like this and scar tissue forms in that position, you can become very tight and have pain sitting or standing with good posture. Try to be aware of your posture and sit and stand up tall to heal properly.  Follow up PT: It is recommended you return every 3 months for the first 3 years following surgery to be assessed on the SOZO machine for an L-Dex score. This helps prevent clinically significant lymphedema in 95% of patients. These follow up screens are 10 minute appointments that you are not billed for.  Closed Chain: Shoulder Abduction / Adduction - on Wall    One hand on wall, step to side and return. Stepping causes shoulder to abduct and adduct. Step __5_ times, holding 5 seconds, __2-3_ times per day.  http://ss.exer.us /266   Copyright  VHI. All rights reserved.   Also - put both hands on the wall. Lock elbows. Stand with feet about 2 feet from the wall and bring your hips back and your head down. Hold 5 seconds, repeat 5 times.  Ian Patton, [place] 11/24/2024 12:16 PM

## 2024-11-24 ENCOUNTER — Other Ambulatory Visit: Payer: Self-pay

## 2024-11-24 ENCOUNTER — Inpatient Hospital Stay: Attending: Hematology and Oncology

## 2024-11-24 ENCOUNTER — Other Ambulatory Visit: Payer: Self-pay | Admitting: *Deleted

## 2024-11-24 ENCOUNTER — Encounter: Payer: Self-pay | Admitting: Physical Therapy

## 2024-11-24 ENCOUNTER — Inpatient Hospital Stay: Attending: Hematology and Oncology | Admitting: Hematology and Oncology

## 2024-11-24 ENCOUNTER — Ambulatory Visit: Attending: Surgery | Admitting: Physical Therapy

## 2024-11-24 VITALS — BP 152/92 | HR 70 | Temp 98.7°F | Resp 18 | Ht 67.0 in | Wt 177.9 lb

## 2024-11-24 DIAGNOSIS — C50121 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: Secondary | ICD-10-CM | POA: Diagnosis not present

## 2024-11-24 DIAGNOSIS — R42 Dizziness and giddiness: Secondary | ICD-10-CM | POA: Diagnosis present

## 2024-11-24 DIAGNOSIS — M79603 Pain in arm, unspecified: Secondary | ICD-10-CM | POA: Diagnosis not present

## 2024-11-24 DIAGNOSIS — R059 Cough, unspecified: Secondary | ICD-10-CM | POA: Insufficient documentation

## 2024-11-24 DIAGNOSIS — Z483 Aftercare following surgery for neoplasm: Secondary | ICD-10-CM | POA: Diagnosis present

## 2024-11-24 DIAGNOSIS — Z170 Estrogen receptor positive status [ER+]: Secondary | ICD-10-CM | POA: Diagnosis present

## 2024-11-24 DIAGNOSIS — C50129 Malignant neoplasm of central portion of unspecified male breast: Secondary | ICD-10-CM | POA: Diagnosis present

## 2024-11-24 DIAGNOSIS — R0602 Shortness of breath: Secondary | ICD-10-CM | POA: Diagnosis not present

## 2024-11-24 DIAGNOSIS — F419 Anxiety disorder, unspecified: Secondary | ICD-10-CM | POA: Insufficient documentation

## 2024-11-24 DIAGNOSIS — R293 Abnormal posture: Secondary | ICD-10-CM | POA: Insufficient documentation

## 2024-11-24 LAB — CBC WITH DIFFERENTIAL (CANCER CENTER ONLY)
Abs Immature Granulocytes: 0.01 K/uL (ref 0.00–0.07)
Basophils Absolute: 0 K/uL (ref 0.0–0.1)
Basophils Relative: 1 %
Eosinophils Absolute: 0.1 K/uL (ref 0.0–0.5)
Eosinophils Relative: 3 %
HCT: 39.8 % (ref 39.0–52.0)
Hemoglobin: 14.2 g/dL (ref 13.0–17.0)
Immature Granulocytes: 0 %
Lymphocytes Relative: 31 %
Lymphs Abs: 1.5 K/uL (ref 0.7–4.0)
MCH: 32 pg (ref 26.0–34.0)
MCHC: 35.7 g/dL (ref 30.0–36.0)
MCV: 89.6 fL (ref 80.0–100.0)
Monocytes Absolute: 0.3 K/uL (ref 0.1–1.0)
Monocytes Relative: 6 %
Neutro Abs: 2.9 K/uL (ref 1.7–7.7)
Neutrophils Relative %: 59 %
Platelet Count: 178 K/uL (ref 150–400)
RBC: 4.44 MIL/uL (ref 4.22–5.81)
RDW: 11.6 % (ref 11.5–15.5)
WBC Count: 4.8 K/uL (ref 4.0–10.5)
nRBC: 0 % (ref 0.0–0.2)

## 2024-11-24 LAB — CMP (CANCER CENTER ONLY)
ALT: 25 U/L (ref 0–44)
AST: 25 U/L (ref 15–41)
Albumin: 4.2 g/dL (ref 3.5–5.0)
Alkaline Phosphatase: 52 U/L (ref 38–126)
Anion gap: 9 (ref 5–15)
BUN: 11 mg/dL (ref 8–23)
CO2: 27 mmol/L (ref 22–32)
Calcium: 9.3 mg/dL (ref 8.9–10.3)
Chloride: 106 mmol/L (ref 98–111)
Creatinine: 0.9 mg/dL (ref 0.61–1.24)
GFR, Estimated: >60 mL/min (ref >60)
Glucose, Bld: 135 mg/dL — ABNORMAL HIGH (ref 70–99)
Potassium: 4.2 mmol/L (ref 3.5–5.1)
Sodium: 141 mmol/L (ref 135–145)
Total Bilirubin: 0.6 mg/dL (ref 0.0–1.2)
Total Protein: 6.6 g/dL (ref 6.5–8.1)

## 2024-11-24 LAB — SAMPLE TO BLOOD BANK

## 2024-11-24 LAB — ABO/RH: ABO/RH(D): O POS

## 2024-11-24 NOTE — Patient Instructions (Signed)
 Closed Chain: Shoulder Abduction / Adduction - on Wall    One hand on wall, step to side and return. Stepping causes shoulder to abduct and adduct. Step __5_ times, holding 5 seconds, __2-3_ times per day.  http://ss.exer.us /266   Copyright  VHI. All rights reserved.   Also - put both hands on the wall. Lock elbows. Stand with feet about 2 feet from the wall and bring your hips back and your head down. Hold 5 seconds, repeat 5 times.   [facility]  [address], [address]  [place] [address]  [phone]  After Breast Cancer Class Video It is recommended you view the ABC class video to be educated on lymphedema risk reduction. This video lasts for about 30 minutes. It can be viewed on our website here: [email or web address]  Scar massage You can begin gentle scar massage to you incision sites. Gently place one hand on the incision and move the skin (without sliding on the skin) in various directions. Do this for a few minutes and then you can gently massage either coconut oil or vitamin E cream into the scars. YOU CAN BEGIN THIS ONCE THE GLUE IS ALL GONE.  Home exercise Program Continue doing the exercises you were given until you feel like you can do them without feeling any tightness at the end.   Walking Program Studies show that 30 minutes of walking per day (fast enough to elevate your heart rate) can significantly reduce the risk of a cancer recurrence. If you can't walk due to other medical reasons, we encourage you to find another activity you could do (like a stationary bike or water exercise).  Posture After breast cancer surgery, people frequently sit with rounded shoulders posture because it puts their incisions on slack and feels better. If you sit like this and scar tissue forms in that position, you can become very tight and have pain sitting or standing with good posture. Try  to be aware of your posture and sit and stand up tall to heal properly.  Follow up PT: It is recommended you return every 3 months for the first 3 years following surgery to be assessed on the SOZO machine for an L-Dex score. This helps prevent clinically significant lymphedema in 95% of patients. These follow up screens are 10 minute appointments that you are not billed for.

## 2024-11-24 NOTE — Progress Notes (Signed)
 Patient Care Team: Charlott Dorn LABOR, MD as PCP - General (Internal Medicine) Okey Vina GAILS, MD as PCP - Cardiology (Cardiology) Signa Rush, MD (Inactive) (Internal Medicine) Gerome Devere HERO, RN as Oncology Nurse Navigator  DIAGNOSIS:  Encounter Diagnosis  Name Primary?   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)  10/15/2024 Initial Diagnosis   Palpable painless nodule right central breast, mammogram and ultrasound: 1.1 cm biopsy: Grade 2 invasive ductal cancer, ER 100%, PR 0%, Ki67 40%, HER2 negative   10/25/2024 Cancer Staging   Staging form: Breast, AJCC 8th Edition - Clinical: Stage IA (cT1b, cN0, cM0, G2, ER+, PR-, HER2-) - Signed by Odean Potts, MD on 10/25/2024 Stage prefix: Initial diagnosis Histologic grading system: 3 grade system     CHIEF COMPLIANT: Lightheadedness episodes x 2   History of Present Illness Ian Patton is a 68 year old male with recently diagnosed right central invasive ductal carcinoma of the breast, status post right mastectomy with sentinel lymph node biopsy, who presents for postoperative evaluation of new-onset lightheadedness, near-syncope, and shortness of breath.  He is in the early postoperative period after right mastectomy with sentinel lymph node biopsy for invasive ductal carcinoma and since surgery has had persistent malaise and does not feel back to baseline.  Over the past two days he has had multiple near-syncope episodes while walking on a treadmill at 3.5 mph, requiring him to stop and hold on to avoid falling, and one episode of lightheadedness while driving that required his wife to take over.  He has intermittent shortness of breath with slow walking outdoors since surgery and a persistent atypical cough with a pulling chest sensation  and tenderness near the surgical area, which he attributes to the surgical tube.  He reports arm pain and a pulling sensation between the ribs with stretching and prescribed exercises, which has partially improved but remains present.  This morning his home blood pressure was 166 mmHg and his oral intake has been poor, limited to a smoothie and a small amount of squash.  His wife confirms the lightheadedness and shortness of breath that led him to stop driving. He has significant stress and anxiety related to the surgery and new symptoms and is very focused on returning to his prior activity level.       ALLERGIES:  is allergic to atorvastatin.  MEDICATIONS:  Current Outpatient Medications  Medication Sig Dispense Refill   aspirin EC 81 MG tablet Take 81 mg by mouth daily. Swallow whole.     ezetimibe (ZETIA) 10 MG tablet Take 10 mg by mouth daily.     Multiple Vitamin (MULTIVITAMIN) capsule Take 1 capsule by mouth daily.     oxyCODONE  (OXY IR/ROXICODONE ) 5 MG immediate release tablet Take 1 tablet (5 mg total) by mouth every 6 (six) hours as needed for severe pain (pain score 7-10). 15 tablet 0   No current facility-administered medications for this visit.    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  11/03/2024: Right mastectomy with sentinel lymph node biopsy 2.  Oncotype DX testing to determine if chemotherapy would be of any benefit followed by 3. Adjuvant antiestrogen therapy with tamoxifen x 5-10 years   4. Genetic Testing requested --------------------------------------------------------------------- Lightheadedness episodes x 2: Today's blood work does not reveal any evidence of anemia.  His blood pressure appears to be stable and he does not have any visible signs of dehydration. I reassured him that some of the symptoms will fade away with the longer he has from surgery. Severe anxiety: I discussed with him that some of the symptoms are possibly exacerbated because of underlying anxiety.  No intervention is required today.  No orders of the defined types were placed in this encounter.  The patient has a good understanding of the overall plan. he agrees with it. he will call with any problems that may develop before the next visit here. Total time spent: 30 mins including face to face time and time spent for planning, charting and co-ordination of care   Viinay K Morene Cecilio, MD 11/24/2024

## 2024-11-24 NOTE — Assessment & Plan Note (Signed)
 Palpable painless nodule right central breast, mammogram and ultrasound: 1.1 cm biopsy: Grade 2 invasive mammary cancer, ER 100%, PR 0%, Ki-67 40%, HER2 negative   Treatment plan: 1.  11/03/2024: Right mastectomy with sentinel lymph node biopsy 2.  Oncotype DX testing to determine if chemotherapy would be of any benefit followed by 3. Adjuvant antiestrogen therapy with tamoxifen x 5-10 years   4. Genetic Testing requested ---------------------------------------------------------------------

## 2024-11-28 ENCOUNTER — Ambulatory Visit

## 2024-11-28 DIAGNOSIS — R293 Abnormal posture: Secondary | ICD-10-CM

## 2024-11-28 DIAGNOSIS — Z170 Estrogen receptor positive status [ER+]: Secondary | ICD-10-CM

## 2024-11-28 DIAGNOSIS — Z483 Aftercare following surgery for neoplasm: Secondary | ICD-10-CM

## 2024-11-28 NOTE — Therapy (Signed)
 OUTPATIENT PHYSICAL THERAPY BREAST CANCER TREATMENT   Patient Name: Ian Patton MRN: [id number] DOB:1956/06/22, 68 y.o., male Today's Date: 11/28/2024  END OF SESSION:  PT End of Session - 11/28/24 1612     Visit Number 3    Number of Visits 10    Date for Recertification  02/02/25    PT Start Time 1603    PT Stop Time 1700    PT Time Calculation (min) 57 min    Activity Tolerance Patient tolerated treatment well    Behavior During Therapy WFL for tasks assessed/performed          Past Medical History:  Diagnosis Date   Hyperlipidemia    Lumbar stenosis    Near syncope    PVC's (premature ventricular contractions)    Past Surgical History:  Procedure Laterality Date   MASTECTOMY W/ SENTINEL NODE BIOPSY Right 11/03/2024   Procedure: MASTECTOMY WITH SENTINEL LYMPH NODE BIOPSY;  Surgeon: Vanderbilt Ned, MD;  Location: [facility];  Service: General;  Laterality: Right;  RIGHT SIMPLE MASTECTOMY RIGHT SENTINEL LYMPH NODE MAPPING   Patient Active Problem List   Diagnosis Date Noted   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 Ned Vanderbilt  REFERRING DIAG: Right breast cancer  THERAPY DIAG:  Abnormal posture  Malignant neoplasm of central portion of breast in male, estrogen receptor positive, unspecified laterality (HCC)  Aftercare following surgery for neoplasm  Rationale for Evaluation and Treatment: Rehabilitation  ONSET DATE: 11/03/2024  SUBJECTIVE:                                                                                                                                                                                           SUBJECTIVE STATEMENT: Dr. Gudena didn't seem to  be worried about my symptoms of SOB and lightheadedness. My labs were normal. My BP has been fluctuating a bit though and that may have something to do with it.   PERTINENT HISTORY:  Patient was diagnosed on 10/15/2024 with right grade 2 invasive ductal carcinoma breast cancer. He underwent a right mastectomy and sentinel node biopsy (2 negative nodes) on 11/03/2024. It is ER positive, PR and HER2 negative with a Ki67 of 40% He has no other medical problems.   PATIENT GOALS:  Reassess how my recovery is going related to arm function, pain, and swelling.  PAIN:  Are you having  pain?No not currently  PRECAUTIONS: Recent Surgery, right UE Lymphedema risk  RED FLAGS: None   ACTIVITY LEVEL / LEISURE: Bikes regularly up to 60 miles at a time; hikes some - prior to his diagnosis was doing some cardio 5x/week    OBJECTIVE:   PATIENT SURVEYS:  QUICK DASH:     OBSERVATIONS: BP 166/101; incision appears to be healing very well. Removed adhesive with adhesive remover from area that was previously covered by bandage. Glue still present at incision. No signs of edema or redness.  POSTURE:  Forward head, rounded shoulders  LYMPHEDEMA ASSESSMENT:   UPPER EXTREMITY AROM/PROM:   A/PROM RIGHT   eval   RIGHT 11/24/2024  Shoulder extension 59 52  Shoulder flexion 148 145  Shoulder abduction 161 147  Shoulder internal rotation 77 57  Shoulder external rotation 90 90                          (Blank rows = not tested)   A/PROM LEFT   eval  Shoulder extension 63  Shoulder flexion 133  Shoulder abduction 157  Shoulder internal rotation 75  Shoulder external rotation 88                          (Blank rows = not tested) CERVICAL AROM: All within normal limits   UPPER EXTREMITY STRENGTH: WNL   LYMPHEDEMA ASSESSMENTS (in cm):    LANDMARK RIGHT   eval RIGHT 11/24/2024  10 cm proximal to olecranon process from proximal aspect of olecranon 28.7 28.2  Olecranon process 27.1 26.9  10  cm proximal to ulnar styloid process from proximal aspect of styloid process 24 24.2  Just distal to ulnar styloid process 18 17.8  Across hand at thumb web space 21.7 21.7  At base of 2nd digit 7.8 7.7  (Blank rows = not tested)   LANDMARK LEFT   eval LEFT 11/24/2024  10 cm proximal to olecranon process from proximal aspect of olecranon 26.9 27.4   Olecranon process 26.2 25.7  10 cm proximal to ulnar styloid process from proximal aspect of styloid process 23 23  Just distal to ulnar styloid process 18 17.6  Across hand at thumb web space 22.1 21.4  At base of 2nd digit 7.1 7  (Blank rows = not tested) Surgery type/Date: 11/03/2024 right mastectomy and sentinel node biopsy Number of lymph nodes removed: 2 Current/past treatment (chemo, radiation, hormone therapy): none Other symptoms:  Heaviness/tightness No Pain Yes Pitting edema No Infections No Decreased scar mobility Yes Stemmer sign No  TODAY'S TREATMENT:  11/28/24: Therapeutic Exercises Roll yellow ball up wall into flex and Rt UE abd x 10 each returning therapist demo Supine over half foam roll for following: Bil UE abd snow angel x 10, 5 sec holds and then bil UE scaption in a V x 10 Rt lateral trunk stretch in doorway reaching OH with Rt UE and leaning hips away from doorframe x 3 reps, 5 sec holds Manual Therapy P/ROM in supine to Rt shoulder into flex, abd and D2 with scapular depression by therapist throughout, scap was fairly limited initially but improved after scap mobs; pt required multiple VC's to relax due due to guarded posture MFR to Rt axilla where mild cording palpated, instructed pt about this STM to Rt pect tendon at insertion and Rt axilla; also in Lt S/L with cocoa butter to medial scap border where multiple trigger points palpable, Rt  UT and along lats and intercostal spaces where pt palpably tight Scap Mobs when in Lt S/L into Rt protraction, retraction and depression with multiple VC's during to  relax due to guarded posture   PATIENT EDUCATION:    Education details: HEP and closed chain shoulder flexion and abduction  Person educated: Patient Education method: Explanation, Demonstration, Tactile cues, Verbal cues, and Handouts Education comprehension: verbalized understanding and returned demonstration  HOME EXERCISE PROGRAM: Reviewed previously given post op HEP. Issued closed chain HEP of shoulder flexion and abduction too.  ASSESSMENT:  CLINICAL IMPRESSION: Patient saw Dr. Odean who ruled out anemia. Pt reports Dr. Odean thinks his symptoms will pass and may be due to pts anxious nature. Today focused on beginning AA/A/ROM stretches for Rt shoulder. Then began manual therapy working to decrease Rt upper quadrant tightness and muscle guarding. Pt with some improvement with end P/ROM after manual therapy.   Pt will benefit from skilled therapeutic intervention to improve on the following deficits: Decreased knowledge of precautions, impaired UE functional use, pain, decreased ROM, postural dysfunction.   PT treatment/interventions: ADL/Self care home management, [phone]- PT Re-evaluation, 97110-Therapeutic exercises, 97530- Therapeutic activity, 97535- Self Care, [id number]- Manual therapy, Patient/Family education, and Scar mobilization   GOALS: Goals reviewed with patient? Yes  GOALS MET AT EVAL:  GOALS Name Target Date Goal status  1 Pt will be able to verbalize understanding of pertinent lymphedema risk reduction practices relevant to her dx specifically related to skin care.  Baseline:  No knowledge Eval Achieved at eval  2 Pt will be able to return demo and/or verbalize understanding of the post op HEP related to regaining shoulder ROM. Baseline:  No knowledge Eval Achieved at eval  3 Pt will be able to verbalize understanding of the importance of viewing the post op After Breast CA Class video for further lymphedema risk reduction education and therapeutic exercise.   Baseline:  No knowledge Eval Achieved at eval   LONG TERM GOALS:  (STG=LTG)  GOALS Name Target Date  Goal status  1 Pt will demonstrate he has regained full shoulder ROM and function post operatively compared to baselines.  Baseline: 02/02/2025 Ongoing  2 Patient will improve his DASH score to return to 0% baseline status for improved overall UE function. 02/02/2025 INITIAL  3 Patient will increase right shoulder abduction to >/= 160 degrees for increased ease reaching. 02/02/2025 INITIAL  4 Patient will report >/= 50% improvement in pain in the axillary region for improved tolerance of daily tasks. 02/03/2024 INITIAL     PLAN:  PT FREQUENCY/DURATION: 2x/week for 4 weeks  PLAN FOR NEXT SESSION: Cont PROM focused on end ROM flexion and abduction; AROM exercises to achieve full ROM; scar massage if all glue is gone and incision appears completely closed.   [facility]  [address]  [place] [address]  [phone]  After Breast Cancer Class Video It is recommended you view the ABC class video to be educated on lymphedema risk reduction. This video lasts for about 30 minutes. It can be viewed on our website here: [email or web address]  Scar massage You can begin gentle scar massage to you incision sites. Gently place one hand on the incision and move the skin (without sliding on the skin) in various directions. Do this for a few minutes and then you can gently massage either coconut oil or vitamin E cream into the scars. YOU CAN BEGIN THIS ONCE THE GLUE IS ALL GONE.  Home exercise Program  Continue doing the exercises you were given until you feel like you can do them without feeling any tightness at the end.   Walking Program Studies show that 30 minutes of walking per day (fast enough to elevate your heart rate) can significantly reduce the risk of a cancer recurrence. If you can't  walk due to other medical reasons, we encourage you to find another activity you could do (like a stationary bike or water exercise).  Posture After breast cancer surgery, people frequently sit with rounded shoulders posture because it puts their incisions on slack and feels better. If you sit like this and scar tissue forms in that position, you can become very tight and have pain sitting or standing with good posture. Try to be aware of your posture and sit and stand up tall to heal properly.  Follow up PT: It is recommended you return every 3 months for the first 3 years following surgery to be assessed on the SOZO machine for an L-Dex score. This helps prevent clinically significant lymphedema in 95% of patients. These follow up screens are 10 minute appointments that you are not billed for.  Closed Chain: Shoulder Abduction / Adduction - on Wall    One hand on wall, step to side and return. Stepping causes shoulder to abduct and adduct. Step __5_ times, holding 5 seconds, __2-3_ times per day.  http://ss.exer.us /266   Copyright  VHI. All rights reserved.   Also - put both hands on the wall. Lock elbows. Stand with feet about 2 feet from the wall and bring your hips back and your head down. Hold 5 seconds, repeat 5 times.  Eward Wonda Sharps, [place] 11/28/2024 5:13 PM

## 2024-11-29 ENCOUNTER — Inpatient Hospital Stay: Admitting: Hematology and Oncology

## 2024-11-29 VITALS — BP 143/88 | HR 65 | Temp 98.3°F | Resp 18 | Wt 177.7 lb

## 2024-11-29 DIAGNOSIS — C50121 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: Secondary | ICD-10-CM

## 2024-11-29 MED ORDER — TAMOXIFEN CITRATE 20 MG PO TABS: 20.0000 mg | ORAL_TABLET | Freq: Every day | ORAL | 3 refills | Status: AC

## 2024-11-29 NOTE — Assessment & Plan Note (Signed)
 Palpable painless nodule right central breast, mammogram and ultrasound: 1.1 cm biopsy: Grade 2 invasive mammary cancer, ER 100%, PR 0%, Ki-67 40%, HER2 negative    Treatment plan: 1.  11/03/2024: Right mastectomy: Grade 2 IDC 1.2 cm, 0/2 lymph nodes negative, ER 100%, PR 0%, Ki-67 40%, HER2 2+ by IHC, FISH negative 2.  Oncotype DX testing t recurrence score: 23 (8% risk of distant recurrence at 10 years) 3. Adjuvant antiestrogen therapy with tamoxifen  x 5-10 years   4. Genetic Testing requested --------------------------------------------------------------------- Anastrozole counseling: We discussed the risks and benefits of anti-estrogen therapy with aromatase inhibitors. These include but not limited to insomnia, hot flashes, mood changes, vaginal dryness, bone density loss, and weight gain. We strongly believe that the benefits far outweigh the risks. Patient understands these risks and consented to starting treatment. Planned treatment duration is 5-7 years.  Return to clinic in 3 months for survivorship care plan visit

## 2024-11-29 NOTE — Progress Notes (Signed)
 Patient Care Team: Charlott Dorn LABOR, MD as PCP - General (Internal Medicine) Okey Vina GAILS, MD as PCP - Cardiology (Cardiology) Signa Rush, MD (Inactive) (Internal Medicine) Gerome Devere HERO, RN as Oncology Nurse Navigator  DIAGNOSIS:  Encounter Diagnosis  Name Primary?   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)  10/15/2024 Initial Diagnosis   Palpable painless nodule right central breast, mammogram and ultrasound: 1.1 cm biopsy: Grade 2 invasive ductal cancer, ER 100%, PR 0%, Ki67 40%, HER2 negative   10/25/2024 Cancer Staging   Staging form: Breast, AJCC 8th Edition - Clinical: Stage IA (cT1b, cN0, cM0, G2, ER+, PR-, HER2-) - Signed by Odean Potts, MD on 10/25/2024 Stage prefix: Initial diagnosis Histologic grading system: 3 grade system     CHIEF COMPLIANT: Follow-up to discuss Oncotype DX test result  HISTORY OF PRESENT ILLNESS: History of Present Illness Ian Patton is a 68 year old male with stage IA, ER-positive, PR-negative, HER2-negative right breast invasive ductal carcinoma, status post right mastectomy with sentinel lymph node biopsy, who presents for oncology follow-up and review of Oncotype DX results to guide adjuvant therapy.  He underwent right mastectomy with sentinel lymph node biopsy for a 1.1 cm, grade 2, ER-positive, PR-negative, HER2-negative invasive ductal carcinoma of the right breast diagnosed in November 2025. Pathology showed no lymph node involvement. Oncotype DX score is 23. He has not received chemotherapy or radiation.  He has no lightheadedness, chest pain, dyspnea, abnormal bleeding, bruising, new palpable masses, or muscle cramps. He has not started tamoxifen  and therefore has no anti-estrogen-related side effects.      ALLERGIES:  is allergic to  atorvastatin.  MEDICATIONS:  Current Outpatient Medications  Medication Sig Dispense Refill   tamoxifen  (NOLVADEX ) 20 MG tablet Take 1 tablet (20 mg total) by mouth daily. 90 tablet 3   aspirin EC 81 MG tablet Take 81 mg by mouth daily. Swallow whole.     ezetimibe (ZETIA) 10 MG tablet Take 10 mg by mouth daily.     Multiple Vitamin (MULTIVITAMIN) capsule Take 1 capsule by mouth daily.     oxyCODONE  (OXY IR/ROXICODONE ) 5 MG immediate release tablet Take 1 tablet (5 mg total) by mouth every 6 (six) hours as needed for severe pain (pain score 7-10). 15 tablet 0   No current facility-administered medications for this visit.    PHYSICAL EXAMINATION: ECOG PERFORMANCE STATUS: 1 - Symptomatic but completely ambulatory  Vitals:   11/29/24 0915  BP: (!) 143/88  Pulse: 65  Resp: 18  Temp: 98.3 F (36.8 C)  SpO2: 100%   Filed Weights   11/29/24 0915  Weight: 177 lb 11.2 oz (80.6 kg)    LABORATORY DATA:  I have reviewed the data as listed    Latest Ref Rng & Units 11/24/2024   12:43 PM  CMP  Glucose 70 - 99 mg/dL 864   BUN 8 - 23 mg/dL 11   Creatinine 9.38 - 1.24 mg/dL 9.09   Sodium 864 - 854 mmol/L 141   Potassium 3.5 - 5.1 mmol/L 4.2   Chloride 98 - 111 mmol/L 106   CO2 22 - 32 mmol/L 27   Calcium 8.9 - 10.3 mg/dL 9.3   Total Protein 6.5 - 8.1 g/dL 6.6   Total Bilirubin 0.0 - 1.2 mg/dL 0.6   Alkaline Phos 38 - 126 U/L 52  AST 15 - 41 U/L 25   ALT 0 - 44 U/L 25     Lab Results  Component Value Date   WBC 4.8 11/24/2024   HGB 14.2 11/24/2024   HCT 39.8 11/24/2024   MCV 89.6 11/24/2024   PLT 178 11/24/2024   NEUTROABS 2.9 11/24/2024    ASSESSMENT & PLAN:  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  11/03/2024: Right mastectomy: Grade 2 IDC 1.2 cm, 0/2 lymph  nodes negative, ER 100%, PR 0%, Ki-67 40%, HER2 2+ by IHC, FISH negative 2.  Oncotype DX testing t recurrence score: 23 (8% risk of distant recurrence at 10 years) 3. Adjuvant antiestrogen therapy with tamoxifen  x 5-10 years   4. Genetic Testing requested --------------------------------------------------------------------- Tamoxifen  counseling: We discussed the risks and benefits of tamoxifen . These include but not limited to insomnia, hot flashes, mood changes, vaginal dryness, and weight gain. Although rare, serious side effects including  risk of blood clots were also discussed. We strongly believe that the benefits far outweigh the risks. Patient understands these risks and consented to starting treatment. Planned treatment duration is 5-10 years.   Return to clinic in 3 months for survivorship care plan visit Patient is extremely anxious and worries about side effects.   No orders of the defined types were placed in this encounter.  The patient has a good understanding of the overall plan. he agrees with it. he will call with any problems that may develop before the next visit here.  I personally spent a total of 30 minutes in the care of the patient today including preparing to see the patient, getting/reviewing separately obtained history, performing a medically appropriate exam/evaluation, counseling and educating, placing orders, referring and communicating with other health care professionals, documenting clinical information in the EHR, independently interpreting results, communicating results, and coordinating care.   Viinay K Malin Sambrano, MD 11/29/2024

## 2024-12-02 ENCOUNTER — Ambulatory Visit

## 2024-12-02 ENCOUNTER — Encounter: Payer: Self-pay | Admitting: *Deleted

## 2024-12-02 DIAGNOSIS — C50121 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: Secondary | ICD-10-CM

## 2024-12-02 DIAGNOSIS — R293 Abnormal posture: Secondary | ICD-10-CM | POA: Diagnosis not present

## 2024-12-02 DIAGNOSIS — C50129 Malignant neoplasm of central portion of unspecified male breast: Secondary | ICD-10-CM

## 2024-12-02 DIAGNOSIS — Z483 Aftercare following surgery for neoplasm: Secondary | ICD-10-CM

## 2024-12-02 NOTE — Therapy (Signed)
 " OUTPATIENT PHYSICAL THERAPY BREAST CANCER TREATMENT   Patient Name: Ian Patton MRN: [id number] DOB:03-Apr-1956, 68 y.o., male Today's Date: 12/02/2024  END OF SESSION:  PT End of Session - 12/02/24 1112     Visit Number 4    Number of Visits 10    Date for Recertification  02/02/25    PT Start Time 1103    PT Stop Time 1158    PT Time Calculation (min) 55 min    Activity Tolerance Patient tolerated treatment well    Behavior During Therapy WFL for tasks assessed/performed          Past Medical History:  Diagnosis Date   Hyperlipidemia    Lumbar stenosis    Near syncope    PVC's (premature ventricular contractions)    Past Surgical History:  Procedure Laterality Date   MASTECTOMY W/ SENTINEL NODE BIOPSY Right 11/03/2024   Procedure: MASTECTOMY WITH SENTINEL LYMPH NODE BIOPSY;  Surgeon: Vanderbilt Ned, MD;  Location: [facility];  Service: General;  Laterality: Right;  RIGHT SIMPLE MASTECTOMY RIGHT SENTINEL LYMPH NODE MAPPING   Patient Active Problem List   Diagnosis Date Noted   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 Ned Vanderbilt  REFERRING DIAG: Right breast cancer  THERAPY DIAG:  Abnormal posture  Malignant neoplasm of central portion of breast in male, estrogen receptor positive, unspecified laterality [facility])  Aftercare following surgery for neoplasm  Rationale for Evaluation and Treatment: Rehabilitation  ONSET DATE: 11/03/2024  SUBJECTIVE:                                                                                                                                                                                           SUBJECTIVE STATEMENT: I think the lightheadedness  is improving slowly. I was very itchy since our last appt, maybe the cocoa butter? But I wasn't sore, the exercises were good.   PERTINENT HISTORY:  Patient was diagnosed on 10/15/2024 with right grade 2 invasive ductal carcinoma breast cancer. He underwent a right mastectomy and sentinel node biopsy (2 negative nodes) on 11/03/2024. It is ER positive, PR and HER2 negative with a Ki67 of 40% He has no other medical problems.   PATIENT GOALS:  Reassess how my recovery is going related to arm function, pain, and swelling.  PAIN:  Are you having pain?No not currently  PRECAUTIONS: Recent Surgery,  right UE Lymphedema risk  RED FLAGS: None   ACTIVITY LEVEL / LEISURE: Bikes regularly up to 60 miles at a time; hikes some - prior to his diagnosis was doing some cardio 5x/week    OBJECTIVE:   PATIENT SURVEYS:  QUICK DASH:     OBSERVATIONS: BP 166/101; incision appears to be healing very well. Removed adhesive with adhesive remover from area that was previously covered by bandage. Glue still present at incision. No signs of edema or redness.  POSTURE:  Forward head, rounded shoulders  LYMPHEDEMA ASSESSMENT:   UPPER EXTREMITY AROM/PROM:   A/PROM RIGHT   eval   RIGHT 11/24/2024  Shoulder extension 59 52  Shoulder flexion 148 145  Shoulder abduction 161 147  Shoulder internal rotation 77 57  Shoulder external rotation 90 90                          (Blank rows = not tested)   A/PROM LEFT   eval  Shoulder extension 63  Shoulder flexion 133  Shoulder abduction 157  Shoulder internal rotation 75  Shoulder external rotation 88                          (Blank rows = not tested) CERVICAL AROM: All within normal limits   UPPER EXTREMITY STRENGTH: WNL   LYMPHEDEMA ASSESSMENTS (in cm):    LANDMARK RIGHT   eval RIGHT 11/24/2024  10 cm proximal to olecranon process from proximal aspect of olecranon 28.7 28.2  Olecranon process 27.1 26.9  10 cm proximal to ulnar styloid  process from proximal aspect of styloid process 24 24.2  Just distal to ulnar styloid process 18 17.8  Across hand at thumb web space 21.7 21.7  At base of 2nd digit 7.8 7.7  (Blank rows = not tested)   LANDMARK LEFT   eval LEFT 11/24/2024  10 cm proximal to olecranon process from proximal aspect of olecranon 26.9 27.4   Olecranon process 26.2 25.7  10 cm proximal to ulnar styloid process from proximal aspect of styloid process 23 23  Just distal to ulnar styloid process 18 17.6  Across hand at thumb web space 22.1 21.4  At base of 2nd digit 7.1 7  (Blank rows = not tested) Surgery type/Date: 11/03/2024 right mastectomy and sentinel node biopsy Number of lymph nodes removed: 2 Current/past treatment (chemo, radiation, hormone therapy): none Other symptoms:  Heaviness/tightness No Pain Yes Pitting edema No Infections No Decreased scar mobility Yes Stemmer sign No  TODAY'S TREATMENT: 12/02/24: Therapeutic Exercises UBE briefly for warm up 45 sec FWD/45 sec BKWD level 1 x 1:30 mins total Roll yellow ball up wall into flex and Rt UE abd x 10 each returning therapist demo Supine over half foam roll for following: Bil UE abd snow angel x 10, 5 sec holds and then bil UE scaption in a V x 10 Lt S/L for Rt open book stretch with knee over foam roll x 5 reps Manual Therapy P/ROM in supine to Rt shoulder into flex, abd and D2 with scapular depression by therapist throughout, scap was still some limited but less so today and less VC's required to relax during stretching MFR to Rt axilla where mild cording palpated STM to Rt pect tendon at insertion and Rt axilla; also in Lt S/L with eucerin to medial scap border where multiple trigger points palpable, Rt UT and along lats and intercostal spaces; also began  gentle scar tissue massage with lotion Scap Mobs when in Lt S/L into Rt protraction, retraction and depression    11/28/24: Therapeutic Exercises Roll yellow ball up wall into  flex and Rt UE abd x 10 each returning therapist demo Supine over half foam roll for following: Bil UE abd snow angel x 10, 5 sec holds and then bil UE scaption in a V x 10 Rt lateral trunk stretch in doorway reaching OH with Rt UE and leaning hips away from doorframe x 3 reps, 5 sec holds Manual Therapy P/ROM in supine to Rt shoulder into flex, abd and D2 with scapular depression by therapist throughout, scap was fairly limited initially but improved after scap mobs; pt required multiple VC's to relax due due to guarded posture MFR to Rt axilla where mild cording palpated, instructed pt about this STM to Rt pect tendon at insertion and Rt axilla; also in Lt S/L with cocoa butter to medial scap border where multiple trigger points palpable, Rt UT and along lats and intercostal spaces where pt palpably tight Scap Mobs when in Lt S/L into Rt protraction, retraction and depression with multiple VC's during to relax due to guarded posture   PATIENT EDUCATION:    Education details: HEP and closed chain shoulder flexion and abduction  Person educated: Patient Education method: Programmer, Multimedia, Demonstration, Tactile cues, Verbal cues, and Handouts Education comprehension: verbalized understanding and returned demonstration  HOME EXERCISE PROGRAM: Reviewed previously given post op HEP. Issued closed chain HEP of shoulder flexion and abduction too.  ASSESSMENT:  CLINICAL IMPRESSION: Pt reports the lightheadedness is seeming to slowly improve, though his systolic BP went fro 138 to 90 after cycling class yesterday. So he will cont to monitor and update MD accordingly. Continued with therapeutic exs to improve Rt shoulder ROM/decrease upper quadrant tightness with manual therapy as well. Pt reports increased itchiness after less session and he though it may have been from the cocoa butter, however upon inspecting his skin there is visible irritation where he had a bandage covering where the drain was.  Not sure why it took this long to irritate him but that is where he was itchy. Skin was very dry so suggested he moisturize here more frequently and used Eucerin today to see if this would be less irritating. Pt verbalized understanding.   Pt will benefit from skilled therapeutic intervention to improve on the following deficits: Decreased knowledge of precautions, impaired UE functional use, pain, decreased ROM, postural dysfunction.   PT treatment/interventions: ADL/Self care home management, [phone]- PT Re-evaluation, 97110-Therapeutic exercises, 97530- Therapeutic activity, 97535- Self Care, 02859- Manual therapy, Patient/Family education, and Scar mobilization   GOALS: Goals reviewed with patient? Yes  GOALS MET AT EVAL:  GOALS Name Target Date Goal status  1 Pt will be able to verbalize understanding of pertinent lymphedema risk reduction practices relevant to her dx specifically related to skin care.  Baseline:  No knowledge Eval Achieved at eval  2 Pt will be able to return demo and/or verbalize understanding of the post op HEP related to regaining shoulder ROM. Baseline:  No knowledge Eval Achieved at eval  3 Pt will be able to verbalize understanding of the importance of viewing the post op After Breast CA Class video for further lymphedema risk reduction education and therapeutic exercise.  Baseline:  No knowledge Eval Achieved at eval   LONG TERM GOALS:  (STG=LTG)  GOALS Name Target Date  Goal status  1 Pt will demonstrate he has regained full shoulder  ROM and function post operatively compared to baselines.  Baseline: 02/02/2025 Ongoing  2 Patient will improve his DASH score to return to 0% baseline status for improved overall UE function. 02/02/2025 INITIAL  3 Patient will increase right shoulder abduction to >/= 160 degrees for increased ease reaching. 02/02/2025 INITIAL  4 Patient will report >/= 50% improvement in pain in the axillary region for improved tolerance of daily  tasks. 02/03/2024 INITIAL     PLAN:  PT FREQUENCY/DURATION: 2x/week for 4 weeks  PLAN FOR NEXT SESSION: Cont PROM focused on end ROM flexion and abduction; AROM exercises to achieve full ROM; scar massage if all glue is gone and incision appears completely closed.  Berwyn Knights, PTA 12/02/2024 12:11 PM   [facility]  [address], [address]  [place] [address]  [phone]      Closed Chain: Shoulder Abduction / Adduction - on Wall    One hand on wall, step to side and return. Stepping causes shoulder to abduct and adduct. Step __5_ times, holding 5 seconds, __2-3_ times per day.  http://ss.exer.us /266   Copyright  VHI. All rights reserved.   Also - put both hands on the wall. Lock elbows. Stand with feet about 2 feet from the wall and bring your hips back and your head down. Hold 5 seconds, repeat 5 times.    "

## 2024-12-02 NOTE — Progress Notes (Signed)
 Left patient voice mail checking in. He had appt with Dr. Odean this week and Tamoxifen  ordered.  Survivorship appt with Manuelita scheduled for March. Survivorship referral placed. Patient has navigator's contact information if needed.

## 2024-12-13 ENCOUNTER — Ambulatory Visit

## 2024-12-19 ENCOUNTER — Ambulatory Visit: Attending: Surgery

## 2024-12-19 DIAGNOSIS — Z483 Aftercare following surgery for neoplasm: Secondary | ICD-10-CM | POA: Insufficient documentation

## 2024-12-19 DIAGNOSIS — Z170 Estrogen receptor positive status [ER+]: Secondary | ICD-10-CM | POA: Diagnosis present

## 2024-12-19 DIAGNOSIS — R293 Abnormal posture: Secondary | ICD-10-CM | POA: Diagnosis present

## 2024-12-19 DIAGNOSIS — C50129 Malignant neoplasm of central portion of unspecified male breast: Secondary | ICD-10-CM | POA: Insufficient documentation

## 2024-12-19 NOTE — Patient Instructions (Addendum)
 Ian Patton

## 2024-12-19 NOTE — Therapy (Signed)
 " OUTPATIENT PHYSICAL THERAPY BREAST CANCER TREATMENT   Patient Name: Ian Patton MRN: [id number] DOB:1956/12/02, 69 y.o., male Today's Date: 12/19/2024  END OF SESSION:  PT End of Session - 12/19/24 1601     Visit Number 5    Number of Visits 10    Date for Recertification  02/02/25    PT Start Time 1558    PT Stop Time 1655    PT Time Calculation (min) 57 min    Activity Tolerance Patient tolerated treatment well    Behavior During Therapy WFL for tasks assessed/performed          Past Medical History:  Diagnosis Date   Hyperlipidemia    Lumbar stenosis    Near syncope    PVC's (premature ventricular contractions)    Past Surgical History:  Procedure Laterality Date   MASTECTOMY W/ SENTINEL NODE BIOPSY Right 11/03/2024   Procedure: MASTECTOMY WITH SENTINEL LYMPH NODE BIOPSY;  Surgeon: Vanderbilt Ned, MD;  Location: [facility];  Service: General;  Laterality: Right;  RIGHT SIMPLE MASTECTOMY RIGHT SENTINEL LYMPH NODE MAPPING   Patient Active Problem List   Diagnosis Date Noted   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 Ned Vanderbilt  REFERRING DIAG: Right breast cancer  THERAPY DIAG:  Abnormal posture  Malignant neoplasm of central portion of breast in male, estrogen receptor positive, unspecified laterality [facility])  Aftercare following surgery for neoplasm  Rationale for Evaluation and Treatment: Rehabilitation  ONSET DATE: 11/03/2024  SUBJECTIVE:                                                                                                                                                                                           SUBJECTIVE STATEMENT: I walked 4 mi yesterday and  had no dizziness after. I've also been able to mostly resume my spin classes but I do still have to take it a bit easier to avoid the dizziness. Overall it is improving though.   PERTINENT HISTORY:  Patient was diagnosed on 10/15/2024 with right grade 2 invasive ductal carcinoma breast cancer. He underwent a right mastectomy and sentinel node biopsy (2 negative nodes) on 11/03/2024. It is ER positive, PR and HER2 negative with a Ki67 of 40% He has no other medical problems.   PATIENT GOALS:  Reassess how my recovery is going related to arm function, pain, and swelling.  PAIN:  Are you having pain?No not currently  PRECAUTIONS: Recent Surgery, right UE Lymphedema risk  RED FLAGS: None   ACTIVITY LEVEL / LEISURE: Bikes regularly up to 60 miles at a time; hikes some - prior to his diagnosis was doing some cardio 5x/week    OBJECTIVE:   PATIENT SURVEYS:  QUICK DASH:     OBSERVATIONS: BP 166/101; incision appears to be healing very well. Removed adhesive with adhesive remover from area that was previously covered by bandage. Glue still present at incision. No signs of edema or redness.  POSTURE:  Forward head, rounded shoulders  LYMPHEDEMA ASSESSMENT:   UPPER EXTREMITY AROM/PROM:   A/PROM RIGHT   eval   RIGHT 11/24/2024 Right 12/19/2024  Shoulder extension 59 52   Shoulder flexion 148 145   Shoulder abduction 161 147 170  Shoulder internal rotation 77 57 65  Shoulder external rotation 90 90                           (Blank rows = not tested)   A/PROM LEFT   eval  Shoulder extension 63  Shoulder flexion 133  Shoulder abduction 157  Shoulder internal rotation 75  Shoulder external rotation 88                          (Blank rows = not tested) CERVICAL AROM: All within normal limits   UPPER EXTREMITY STRENGTH: WNL   LYMPHEDEMA ASSESSMENTS (in cm):    LANDMARK RIGHT   eval RIGHT 11/24/2024  10 cm proximal to olecranon process from proximal aspect of olecranon  28.7 28.2  Olecranon process 27.1 26.9  10 cm proximal to ulnar styloid process from proximal aspect of styloid process 24 24.2  Just distal to ulnar styloid process 18 17.8  Across hand at thumb web space 21.7 21.7  At base of 2nd digit 7.8 7.7  (Blank rows = not tested)   LANDMARK LEFT   eval LEFT 11/24/2024  10 cm proximal to olecranon process from proximal aspect of olecranon 26.9 27.4   Olecranon process 26.2 25.7  10 cm proximal to ulnar styloid process from proximal aspect of styloid process 23 23  Just distal to ulnar styloid process 18 17.6  Across hand at thumb web space 22.1 21.4  At base of 2nd digit 7.1 7  (Blank rows = not tested) Surgery type/Date: 11/03/2024 right mastectomy and sentinel node biopsy Number of lymph nodes removed: 2 Current/past treatment (chemo, radiation, hormone therapy): none Other symptoms:  Heaviness/tightness No Pain Yes Pitting edema No Infections No Decreased scar mobility Yes Stemmer sign No  TODAY'S TREATMENT: 12/19/24: Therapeutic Exercises Roll yellow ball up wall into flex and Rt UE abd x 10 each with VC's to decrease scapula compensation Supine over full foam roll for following: Bil UE abd snow angel x 10, 5 sec holds and then bil UE scaption in a V x 10 Modified downward dog on wall x 5 reps, 5 sec holds returning therapist demo Therapeutic Activities Standing with back against wall/core engaged, head/shoulders against wall as able for bil UE 3 way raises with 2# into flex, scaption and abd to slightly above shoulder height x 10 reps each, handout issued to add to HEP. Pt was educated in low and slow progression of exercises for risk reduction of lymphedema.  Manual Therapy: Eucerin instead of cocoa butter P/ROM in supine to Rt shoulder into flex, abd, er  and D2 with scapular depression by therapist throughout, much improvement noted in scap mobility MFR to Rt axilla where tightness palpable, but cording was not visible  today STM to Rt pect tendon at insertion and Rt axilla where tightness is still palpable in both areas; also in Lt S/L with eucerin to medial scap border where only one small trigger point noted today, much improved, and along Rt lats and intercostal spaces; also continued gentle scar tissue massage with lotion Scap Mobs when in Lt S/L into Rt protraction, retraction and depression   12/02/24: Therapeutic Exercises UBE briefly for warm up 45 sec FWD/45 sec BKWD level 1 x 1:30 mins total Roll yellow ball up wall into flex and Rt UE abd x 10 each returning therapist demo Supine over half foam roll for following: Bil UE abd snow angel x 10, 5 sec holds and then bil UE scaption in a V x 10 Lt S/L for Rt open book stretch with knee over foam roll x 5 reps Manual Therapy P/ROM in supine to Rt shoulder into flex, abd and D2 with scapular depression by therapist throughout, scap was still some limited but less so today and less VC's required to relax during stretching MFR to Rt axilla where mild cording palpated STM to Rt pect tendon at insertion and Rt axilla; also in Lt S/L with eucerin to medial scap border where multiple trigger points palpable, Rt UT and along lats and intercostal spaces; also began gentle scar tissue massage with lotion Scap Mobs when in Lt S/L into Rt protraction, retraction and depression    11/28/24: Therapeutic Exercises Roll yellow ball up wall into flex and Rt UE abd x 10 each returning therapist demo Supine over half foam roll for following: Bil UE abd snow angel x 10, 5 sec holds and then bil UE scaption in a V x 10 Rt lateral trunk stretch in doorway reaching OH with Rt UE and leaning hips away from doorframe x 3 reps, 5 sec holds Manual Therapy P/ROM in supine to Rt shoulder into flex, abd and D2 with scapular depression by therapist throughout, scap was fairly limited initially but improved after scap mobs; pt required multiple VC's to relax due due to guarded  posture MFR to Rt axilla where mild cording palpated, instructed pt about this STM to Rt pect tendon at insertion and Rt axilla; also in Lt S/L with cocoa butter to medial scap border where multiple trigger points palpable, Rt UT and along lats and intercostal spaces where pt palpably tight Scap Mobs when in Lt S/L into Rt protraction, retraction and depression with multiple VC's during to relax due to guarded posture   PATIENT EDUCATION:    Education details: Standing bil UE 3 way raises  Person educated: Patient Education method: Explanation, Demonstration, Tactile cues, Verbal cues, and Handouts Education comprehension: verbalized understanding and returned demonstration  HOME EXERCISE PROGRAM: Reviewed previously given post op HEP. Issued closed chain HEP of shoulder flexion and abduction too. Standing bil UE 3 way raises  ASSESSMENT:  CLINICAL IMPRESSION: Pt reports he has been feeling much improved since being here last. His endurance is improving as he was able to walk up to 4 mi yesterday and is resuming his spin classes, though does still have to take it easy so as not to trigger the dizziness he has been experiencing. Continued with AA/ROM exs and progressed pt to include bil UE strength and added this to his HEP along with education for risk reduction practice  for lymphedema. Pt is doing very well and questioned need for continued physical therapy at this time. Suggested that he could return in a few weeks for a possible final assessment as he is returning to his normal activities. At that time we can reassess cording, HEP and A/ROM. He agreed and made a visit for 2/2.   Pt will benefit from skilled therapeutic intervention to improve on the following deficits: Decreased knowledge of precautions, impaired UE functional use, pain, decreased ROM, postural dysfunction.   PT treatment/interventions: ADL/Self care home management, [phone]- PT Re-evaluation, 97110-Therapeutic exercises,  97530- Therapeutic activity, 97535- Self Care, [id number]- Manual therapy, Patient/Family education, and Scar mobilization   GOALS: Goals reviewed with patient? Yes  GOALS MET AT EVAL:  GOALS Name Target Date Goal status  1 Pt will be able to verbalize understanding of pertinent lymphedema risk reduction practices relevant to her dx specifically related to skin care.  Baseline:  No knowledge Eval Achieved at eval  2 Pt will be able to return demo and/or verbalize understanding of the post op HEP related to regaining shoulder ROM. Baseline:  No knowledge Eval Achieved at eval  3 Pt will be able to verbalize understanding of the importance of viewing the post op After Breast CA Class video for further lymphedema risk reduction education and therapeutic exercise.  Baseline:  No knowledge Eval Achieved at eval   LONG TERM GOALS:  (STG=LTG)  GOALS Name Target Date  Goal status  1 Pt will demonstrate he has regained full shoulder ROM and function post operatively compared to baselines.  Baseline: 02/02/2025 Ongoing  2 Patient will improve his DASH score to return to 0% baseline status for improved overall UE function. 02/02/2025 INITIAL  3 Patient will increase right shoulder abduction to >/= 160 degrees for increased ease reaching. 02/02/2025 MET  12/19/24 - 170 degrees  4 Patient will report >/= 50% improvement in pain in the axillary region for improved tolerance of daily tasks. 02/03/2024 MET 12/19/24 - 90% improvement noted     PLAN:  PT FREQUENCY/DURATION: 2x/week for 4 weeks  PLAN FOR NEXT SESSION: Consider D/C next as pt is doing very well and returning to his normal activities; Cont PROM focused on end ROM flexion and abduction; AROM exercises to achieve full ROM; scar massage if all glue is gone and incision appears completely closed.  Berwyn Knights, PTA 12/19/2024 5:09 PM   [facility]  [address], [address]  [place] [address]  [phone]  [address]  Raises:      Starting Position:  Leaning against wall, walk feet a few inches away from the wall and make tummy tight (tuck hips underneath you) Press back/shoulders/head against wall as much as possible. Keep thumbs up to ceiling, elbows straight and shoulders relaxed/down throughout.  1. Lift arms in front to shoulder height 2. Lift arms a little wider into a V to shoulder height 3. Lift arms out to sides in a T to shoulder height  Perform 10 times in each direction. Hold 1-2 lbs to start with and work up to 2-3 sets of 10/day. Perform 3-4 times/week. Increase weight as able, decreasing sets of 10 each time you increase weights, then slowly working your way back up to 2-3 sets each time.    Cancer Rehab [phone]    "

## 2024-12-26 ENCOUNTER — Inpatient Hospital Stay: Attending: Hematology and Oncology | Admitting: Genetic Counselor

## 2024-12-26 ENCOUNTER — Inpatient Hospital Stay: Attending: Hematology and Oncology

## 2024-12-26 ENCOUNTER — Encounter: Payer: Self-pay | Admitting: Genetic Counselor

## 2024-12-26 DIAGNOSIS — C50121 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: Secondary | ICD-10-CM

## 2024-12-26 LAB — GENETIC SCREENING ORDER

## 2024-12-26 NOTE — Progress Notes (Signed)
 REFERRING PROVIDER: Odean Potts, MD [address] [place],  [place] [address]  PRIMARY PROVIDER:  Charlott Dorn LABOR, MD  PRIMARY REASON FOR VISIT:  1. 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 Anne, a 69 y.o. male, was seen for a [facility] cancer genetics consultation at the request of Odean Potts, MD due to a personal history of breast cancer.  Mr. Nohr presents to clinic today to discuss the possibility of a hereditary predisposition to cancer, to discuss genetic testing, and to further clarify his future cancer risks, as well as potential cancer risks for family members.   In 2025, at the age of 43, Mr. Brockmann was diagnosed with right breast cancer, ER+, PR-, Her2-. He was treated with right mastectomy and is being treated with adjuvant antiestrogen therapy.   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%, HER2 negative   10/25/2024 Cancer Staging   Staging form: Breast, AJCC 8th Edition - Clinical: Stage IA (cT1b, cN0, cM0, G2, ER+, PR-, HER2-) - Signed by Odean Potts, MD on 10/25/2024 Stage prefix: Initial diagnosis Histologic grading system: 3 grade system     RELEVANT MEDICAL HISTORY:  Reports history of normal PSA  Colonoscopy, couple of polyps, otherwise reports normal    Past Medical History:  Diagnosis Date   Hyperlipidemia    Lumbar stenosis    Near syncope    PVC's (premature ventricular contractions)     Past Surgical History:  Procedure Laterality Date   MASTECTOMY W/ SENTINEL NODE BIOPSY Right 11/03/2024   Procedure: MASTECTOMY WITH SENTINEL LYMPH NODE BIOPSY;  Surgeon: Vanderbilt Ned, MD;   Location: [facility];  Service: General;  Laterality: Right;  RIGHT SIMPLE MASTECTOMY RIGHT SENTINEL LYMPH NODE MAPPING    Social History   Socioeconomic History   Marital status: Married    Spouse name: Not on file   Number of children: Not on file   Years of education: Not on file   Highest education level: Not on file  Occupational History   Not on file  Tobacco Use   Smoking status: Former    Types: Cigarettes   Smokeless tobacco: Never  Substance and Sexual Activity   Alcohol use: Not Currently    Alcohol/week: 1.0 - 2.0 standard drink of alcohol    Types: 1 - 2 Glasses of wine per week   Drug use: No   Sexual activity: Not on file  Other Topics Concern   Not on file  Social History Narrative   ** Merged History Encounter **       Social Drivers of Health   Tobacco Use: Medium Risk (12/19/2024)   Patient History    Smoking Tobacco Use: Former    Smokeless Tobacco Use: Never    Passive Exposure: Not on Actuary Strain: Not on file  Food Insecurity: No Food Insecurity (10/25/2024)   Epic    Worried About Programme Researcher, Broadcasting/film/video in the Last Year: Never true    [employer] of Food in the Last Year: Never true  Transportation Needs: No Transportation Needs (10/25/2024)   Epic    Lack of Transportation (Medical): No    Lack  of Transportation (Non-Medical): No  Physical Activity: Not on file  Stress: Not on file  Social Connections: Not on file  Depression (EYV7-0): Not on file  Alcohol Screen: Not on file  Housing: Low Risk (10/25/2024)   Epic    Unable to Pay for Housing in the Last Year: No    Number of Times Moved in the Last Year: 0    Homeless in the Last Year: No  Utilities: Not At Risk (10/25/2024)   Epic    Threatened with loss of utilities: No  Health Literacy: Not on file     FAMILY HISTORY:  We obtained a detailed, 4-generation family history.  Significant diagnoses are listed below: Family History  Problem Relation Age  of Onset   Dementia Mother    Dementia Father    Schizophrenia Brother    Suicidality Brother    Suicidality Brother    Tongue cancer Maternal Cousin    Other Maternal Cousin        possible breast cancer? unsure    Mr. Gangemi is unaware of previous family history of genetic testing for hereditary cancer risks. There is no reported Ashkenazi Jewish ancestry.     GENETIC COUNSELING ASSESSMENT: Mr. Prewitt is a 69 y.o. male with a personal history of breast cancer which is somewhat suggestive of a hereditary predisposition to cancer given that male breast cancer is more likely to be hereditary compared to other cancer types. We, therefore, discussed and recommended the following at today's visit.   DISCUSSION: We discussed that 5 - 10% of cancer is hereditary, with most cases of breast associated with pathogenic variants in BRCA2.  There are other genes that can be associated with hereditary breast cancer syndromes.  We discussed that testing is beneficial for several reasons including knowing how to follow individuals after completing their treatment, identifying whether potential treatment options would be beneficial, and understanding if other family members could be at risk for cancer and allowing them to undergo genetic testing.   We reviewed the characteristics, features and inheritance patterns of hereditary cancer syndromes. We also discussed genetic testing, including the appropriate family members to test, the process of testing, insurance coverage and turn-around-time for results. We discussed the implications of a negative, positive, carrier and/or variant of uncertain significant result. We recommended Mr. Connaughton pursue genetic testing for a panel that includes genes associated with breast cancer.   Mr. Trela  was offered a breast cancer management panel, common hereditary cancer panel (40+ genes) and an expanded pan-cancer panel (70+ genes). Mr. Damron was informed of  the benefits and limitations of each panel, including that expanded pan-cancer panels contain genes that do not have clear management guidelines at this point in time.  We also discussed that as the number of genes included on a panel increases, the chances of variants of uncertain significance increases. After considering the benefits and limitations of each gene panel, Mr. Karel elected to have [facility]'s Common Hereditary Cancers panel +RNA.  Based on Mr. Inabinet's personal history of cancer, he meets medical criteria for genetic testing. Despite that he meets criteria, he may still have an out of pocket cost. If his out of pocket cost for testing is over $250, the laboratory should contact them to discuss self-pay prices, patient pay assistance programs, if applicable, and other billing options.  We discussed that some people do not want to undergo genetic testing due to fear of genetic discrimination.  A federal law called the Genetic Information Non-Discrimination  Act (GINA) of 2008 helps protect individuals against genetic discrimination based on their genetic test results.  It impacts both health insurance and employment.  With health insurance, it protects against increased premiums, being kicked off insurance or being forced to take a test in order to be insured.  For employment it protects against hiring, firing and promoting decisions based on genetic test results.  GINA does not apply to those in the [employer], those who work for companies with less than 15 employees, and new life insurance or long-term disability insurance policies.  Health status due to a cancer diagnosis is not protected under GINA.  PLAN: After considering the risks, benefits, and limitations, Mr. Medina provided informed consent to pursue genetic testing and the blood sample was sent to [facility] for analysis of the Common Hereditary Cancers +RNA. Results should be available within approximately 2-3 weeks'  time, at which point they will be disclosed by telephone to Mr. Mcisaac, as will any additional recommendations warranted by these results. Mr. Tetrault will receive a summary of his genetic counseling visit and a copy of his results once available. This information will also be available in Epic.   Lastly, we encouraged Mr. Mcnulty to remain in contact with cancer genetics annually so that we can continuously update the family history and inform him of any changes in cancer genetics and testing that may be of benefit for this family.   Mr. Cedrone's questions were answered to his satisfaction today. Our contact information was provided should additional questions or concerns arise. Thank you for the referral and allowing us  to share in the care of your patient.   Burnard Ogren, MS, [facility] Licensed, [email or web address] phone: [phone]   50 minutes were spent on the date of the encounter in service to the patient including preparation, face-to-face consultation, documentation and care coordination.  The patient brought his wife, Elouise.   Drs. Gudena was available to discuss this case as needed.   _______________________________________________________________________ For Office Staff:  Number of people involved in session: 2 Was an Intern/ student involved with case: no

## 2025-01-11 ENCOUNTER — Ambulatory Visit: Payer: Self-pay | Admitting: Genetic Counselor

## 2025-01-11 ENCOUNTER — Telehealth: Payer: Self-pay | Admitting: Genetic Counselor

## 2025-01-11 DIAGNOSIS — C50121 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: Secondary | ICD-10-CM

## 2025-01-11 DIAGNOSIS — Z1379 Encounter for other screening for genetic and chromosomal anomalies: Secondary | ICD-10-CM | POA: Insufficient documentation

## 2025-01-11 NOTE — Telephone Encounter (Signed)
 I contacted  Ian Patton to discuss his genetic testing results. No pathogenic variants were identified in the 48 genes analyzed. Discussed that we do not know why he has cancer. It could be due to a different gene that we are not testing, or maybe our current technology may not be able to pick something up.  It will be important for him to keep in contact with genetics to keep up with whether additional testing may be needed.Detailed clinic note to follow.   The test report will be scanned into EPIC and will be located under the Molecular Pathology section of the Results Review tab.  A portion of the result report is included below for reference.

## 2025-01-11 NOTE — Progress Notes (Signed)
 HPI:  Mr. Puglia was previously seen in the [facility] due to a personal and family history of cancer and concerns regarding a hereditary predisposition to cancer. Please refer to our prior cancer genetics clinic note for more information regarding our discussion, assessment and recommendations, at the time. Mr. Burbridge's recent genetic test results were disclosed to him, as were recommendations warranted by these results. These results and recommendations are discussed in more detail below.  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 Report date is 01/06/2025.  [facility] Common Hereditary Cancers + RNA Panel offered by [facility] includes sequencing, del/dup, and RNA analysis for the following 48 genes: APC, ATM, AXIN2, BAP1, BARD1,BMPR1A, BRCA1, BRCA2, BRIP1, CDH1, CDK4, CDKN2A, CHEK2, CTNNA1, DICER1, EPCAM, FH, GREM1, HOXB13, KIT, MBD4, MEN1, MLH1, MSH2, MSH3, MSH6, MUTYH, NF1, NTHL1, PALB2, PDGFRA, PMS2, POLD1, POLE, PTEN, RAD51C, RAD51D, SDHA, SDHB, SDHC, SDHD, SMAD4, SMARCA4, STK11, TP53, TSC1, TSC2, VHL.      FAMILY HISTORY:  We obtained a detailed, 4-generation family history.  Significant diagnoses are listed below: Family History  Problem Relation Age of Onset   Dementia Mother    Dementia Father    Schizophrenia Brother    Suicidality Brother    Suicidality Brother    Tongue cancer Maternal Cousin    Other  Maternal Cousin        possible breast cancer? unsure    Mr. Koerber is unaware of previous family history of genetic testing for hereditary cancer risks. There is no reported Ashkenazi Jewish ancestry.        GENETIC TEST RESULTS: Genetic testing reported out on January 06, 2025 through the [facility] Common Hereditary Cancer Panel + RNA cancer panel found no pathogenic mutations. The Common Hereditary Gene Panel offered by [facility] includes sequencing and/or deletion duplication testing of the following 48 genes: APC, ATM, AXIN2, BAP1, BARD1, BMPR1A, BRCA1, BRCA2, BRIP1, CDH1, CDK4, CDKN2A (p14ARF), CDKN2A (p16INK4a), CHEK2, CTNNA1, DICER1, EPCAM (Deletion/duplication testing only), GREM1 (promoter region deletion/duplication testing only), KIT, MEN1, MLH1, MSH2, MSH3, MSH6, MUTYH, NBN, NF1, NHTL1, PALB2, PDGFRA, PMS2, POLD1, POLE, PTEN, RAD50, RAD51C, RAD51D, SDHB, SDHC, SDHD, SMAD4, SMARCA4. STK11, TP53, TSC1, TSC2, and VHL.  The following genes were evaluated for sequence changes only: SDHA and HOXB13 c.251G>A variant only. The test report has been scanned into EPIC and is located under the Molecular Pathology section of the Results Review tab.  A portion of the result report is included below for reference.     We discussed with Mr. Tukes that because current genetic testing is not perfect, it is possible there may be a gene mutation in one of these genes that current testing cannot detect, but that chance is small.  We also discussed, that there could be another gene that has not yet been discovered, or that we have not yet tested, that is responsible for the cancer diagnoses in the family. It is also possible there is a hereditary cause for the cancer  in the family that Mr. Mundie did not inherit and therefore was not identified in his testing.  Therefore, it is important to remain in touch with cancer genetics in the future so that we can continue to offer Mr. Elko the most up to  date genetic testing.   ADDITIONAL GENETIC TESTING: We discussed with Mr. Mounts that there are other genes that are associated with increased cancer risk that can be analyzed. Should Mr. Mantz wish to pursue additional genetic testing, we are happy to discuss and coordinate this testing, at any time.    CANCER SCREENING RECOMMENDATIONS: Mr. Heinzel's test result is considered negative (normal).  This means that we have not identified a hereditary cause for his personal and family history of cancer at this time. Most cancers happen by chance and this negative test suggests that his personal and family history of cancer may fall into this category.    Possible reasons for Mr. Weedman's negative genetic test include:  1. There may be a gene mutation in one of these genes that current testing methods cannot detect but that chance is small.  2. There could be another gene that has not yet been discovered, or that we have not yet tested, that is responsible for the cancer diagnoses in the family.  3.  There may be no hereditary risk for cancer in the family. The cancers in Mr. Damiano and/or his family may be sporadic/familial or due to other genetic and environmental factors. 4. It is also possible there is a hereditary cause for the cancer in the family that Mr. Garske did not inherit.  Therefore, it is recommended he continue to follow the cancer management and screening guidelines provided by his oncology and primary healthcare provider. An individual's cancer risk and medical management are not determined by genetic test results alone. Overall cancer risk assessment incorporates additional factors, including personal medical history, family history, and any available genetic information that may result in a personalized plan for cancer prevention and surveillance  RECOMMENDATIONS FOR FAMILY MEMBERS:   Since he did not inherit a identifiable mutation in a cancer predisposition gene  included on this panel, his children could not have inherited a known mutation from his in one of these genes. Individuals in this family might be at some increased risk of developing cancer, over the general population risk, simply due to the family history of cancer.  We recommended women in this family have a yearly mammogram beginning at age 6, or 36 years younger than the earliest onset of cancer, an annual clinical breast exam, and perform monthly breast self-exams. Women in this family should also have a gynecological exam as recommended by their primary provider. All family members should be referred for colonoscopy starting at age 64, or 42 years younger than the earliest onset of cancer.  FOLLOW-UP: Lastly, we discussed with Mr. Dollar that cancer genetics is a rapidly advancing field and it is possible that new genetic tests will be appropriate for him and/or his family members in the future. We encouraged him to remain in contact with cancer genetics on an annual basis so we can update his personal and family histories and let him know of advances in cancer genetics that may benefit this family.   Our contact number was provided. Mr. Westendorf's questions were answered to his satisfaction, and he knows he is welcome to call us  at anytime with additional questions or concerns.   Darice Monte, MS, [facility] Licensed, Certified Genetic Counselor [email or web address]

## 2025-01-16 ENCOUNTER — Ambulatory Visit: Attending: Surgery

## 2025-01-20 ENCOUNTER — Encounter: Payer: Self-pay | Admitting: *Deleted

## 2025-01-20 NOTE — Progress Notes (Signed)
 Patient left navigator voice mail requesting a call back. He said he wonders if his dose of his AI needs to be adjusted as having symptoms that come and go such as being tired, dizzy, or having a headache. He wanted to ask Dr. Odean as his next f/u isn't until mid March. He will call back to the main number on Monday and speak to Dr. Gara nurse. Patient said other than that no other needs/concerns at this time.

## 2025-01-30 ENCOUNTER — Ambulatory Visit: Attending: Surgery | Admitting: Rehabilitation

## 2025-02-02 ENCOUNTER — Ambulatory Visit

## 2025-02-28 ENCOUNTER — Inpatient Hospital Stay: Admitting: Adult Health

## 2025-02-28 ENCOUNTER — Inpatient Hospital Stay
